# Patient Record
Sex: Male | Born: 1965 | Race: White | Hispanic: No | Marital: Married | Smoking: Never smoker
Health system: Southern US, Community
[De-identification: ages and names within clinical notes are randomized; demographics above are authoritative.]

## PROBLEM LIST (undated history)

## (undated) DIAGNOSIS — M109 Gout, unspecified: Secondary | ICD-10-CM

## (undated) DIAGNOSIS — K5792 Diverticulitis of intestine, part unspecified, without perforation or abscess without bleeding: Secondary | ICD-10-CM

## (undated) DIAGNOSIS — K219 Gastro-esophageal reflux disease without esophagitis: Secondary | ICD-10-CM

## (undated) HISTORY — DX: Diverticulitis of intestine, part unspecified, without perforation or abscess without bleeding: K57.92

---
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
## 2008-03-13 ENCOUNTER — Ambulatory Visit: Payer: Self-pay | Admitting: Gastroenterology

## 2008-03-13 ENCOUNTER — Inpatient Hospital Stay: Payer: Self-pay | Admitting: Gastroenterology

## 2008-05-08 ENCOUNTER — Ambulatory Visit: Payer: Self-pay | Admitting: Gastroenterology

## 2008-09-19 DIAGNOSIS — K5792 Diverticulitis of intestine, part unspecified, without perforation or abscess without bleeding: Secondary | ICD-10-CM

## 2008-09-19 HISTORY — DX: Diverticulitis of intestine, part unspecified, without perforation or abscess without bleeding: K57.92

## 2011-09-22 ENCOUNTER — Ambulatory Visit: Payer: Self-pay | Admitting: Gastroenterology

## 2015-07-01 ENCOUNTER — Encounter: Payer: Self-pay | Admitting: *Deleted

## 2015-07-28 ENCOUNTER — Ambulatory Visit (INDEPENDENT_AMBULATORY_CARE_PROVIDER_SITE_OTHER): Payer: 59 | Admitting: General Surgery

## 2015-07-28 ENCOUNTER — Encounter: Payer: Self-pay | Admitting: General Surgery

## 2015-07-28 VITALS — BP 140/82 | HR 80 | Resp 14 | Ht 71.0 in | Wt 261.0 lb

## 2015-07-28 DIAGNOSIS — K429 Umbilical hernia without obstruction or gangrene: Secondary | ICD-10-CM | POA: Diagnosis not present

## 2015-07-28 NOTE — Progress Notes (Signed)
Patient ID: Francisco Roman, male   DOB: 03/09/1966, 49 y.o.   MRN: [id number]  Chief Complaint  Patient presents with  . Hernia    HPI Francisco Roman is a 49 y.o. male.  Here today for evaluation of umbilical hernia. He states he noticed it about 3 years ago. He states it has gotten larger over the past year. He states it is tender. Bowels move regular and daily with the use of Miralax..  In 2010 the patient had a severe episode of left lower quadrant pain. He was separately identified to have a microperforation. He was treated with IV antibiotic. He did not require percutaneous drainage. He intermittently will have some mild left lower quadrant discomfort but otherwise has been asymptomatic.  I personally reviewed the history with the patient.   HPI  Past Medical History  Diagnosis Date  . Diverticulitis 2010    with small perforation    Past Surgical History  Procedure Laterality Date  . Hernia repair Left 1986    No family history on file.  Social History Social History  Substance Use Topics  . Smoking status: Never Smoker   . Smokeless tobacco: Never Used  . Alcohol Use: 0.0 oz/week    0 Standard drinks or equivalent per week     Comment: 12/week     No Known Allergies  Current Outpatient Prescriptions  Medication Sig Dispense Refill  . Multiple Vitamin (MULTIVITAMIN) capsule Take 1 capsule by mouth daily.    . polyethylene glycol (MIRALAX / GLYCOLAX) packet Take 17 g by mouth daily.      No current facility-administered medications for this visit.    Review of Systems Review of Systems  Constitutional: Negative.   Respiratory: Negative.   Cardiovascular: Negative.   Gastrointestinal: Negative for diarrhea, constipation and blood in stool.    Blood pressure 140/82, pulse 80, resp. rate 14, height 5\' 11"  (1.803 m), weight 261 lb (118.389 kg).  Physical Exam Physical Exam  Constitutional: He is oriented to person, place, and time. He appears  well-developed and well-nourished.  HENT:  Mouth/Throat: Oropharynx is clear and moist.  Eyes: No scleral icterus.  Neck: Neck supple.  Cardiovascular: Normal rate, regular rhythm and normal heart sounds.   Pulmonary/Chest: Effort normal and breath sounds normal.  Abdominal: Soft. Normal appearance and bowel sounds are normal. There is tenderness.  1 cm umbilical facial defect with tenderness.  Neurological: He is alert and oriented to person, place, and time.  Skin: Skin is warm.  Psychiatric: His behavior is normal.    Data Reviewed P notes from Maryland Pink, M.D. were reviewed. Hernia listed as 2-3 cm, this is the external bulge present fashioned defect.  Laboratory studies obtained during the 07/01/2015 exam showed a normal CBC with a hemoglobin of 14.9, MCV 94, white blood cell count 7300, normal differential, electrolytes were normal. Creatinine 1.0 with an estimated GFR of 80. Liver function studies were normal. Urinalysis was negative. Cholesterol and triglycerides were remarkably normal. PSA 0.69.  Assessment    Umbilical hernia    Plan    Indications for repair were reviewed. If the present size clinically estimated is correct, mesh will not be required. The role of prosthetic mesh was discussed. Upper lifting technique was reviewed.     Hernia precautions and incarceration were discussed with the patient. If they develop symptoms of an incarcerated hernia, they were encouraged to seek prompt medical attention.   Patient is scheduled for surgery at [facility] on 08/26/15. The  patient will pre admit via phone. He is aware of date and instructions.   Proper lifting techniques reviewed.   PCP:  Francisco Roman 07/29/2015, 6:12 AM

## 2015-07-28 NOTE — Patient Instructions (Addendum)
The patient is aware to call back for any questions or concerns. Hernia, Adult A hernia is the bulging of an organ or tissue through a weak spot in the muscles of the abdomen (abdominal wall). Hernias develop most often near the navel or groin. There are many kinds of hernias. Common kinds include:  Femoral hernia. This kind of hernia develops under the groin in the upper thigh area.  Inguinal hernia. This kind of hernia develops in the groin or scrotum.  Umbilical hernia. This kind of hernia develops near the navel.  Hiatal hernia. This kind of hernia causes part of the stomach to be pushed up into the chest.  Incisional hernia. This kind of hernia bulges through a scar from an abdominal surgery. CAUSES This condition may be caused by:  Heavy lifting.  Coughing over a long period of time.  Straining to have a bowel movement.  An incision made during an abdominal surgery.  A birth defect (congenital defect).  Excess weight or obesity.  Smoking.  Poor nutrition.  Cystic fibrosis.  Excess fluid in the abdomen.  Undescended testicles. SYMPTOMS Symptoms of a hernia include:  A lump on the abdomen. This is the first sign of a hernia. The lump may become more obvious with standing, straining, or coughing. It may get bigger over time if it is not treated or if the condition causing it is not treated.  Pain. A hernia is usually painless, but it may become painful over time if treatment is delayed. The pain is usually dull and may get worse with standing or lifting heavy objects. Sometimes a hernia gets tightly squeezed in the weak spot (strangulated) or stuck there (incarcerated) and causes additional symptoms. These symptoms may include:  Vomiting.  Nausea.  Constipation.  Irritability. DIAGNOSIS A hernia may be diagnosed with:  A physical exam. During the exam your health care provider may ask you to cough or to make a specific movement, because a hernia is usually  more visible when you move.  Imaging tests. These can include:  X-rays.  Ultrasound.  CT scan. TREATMENT A hernia that is small and painless may not need to be treated. A hernia that is large or painful may be treated with surgery. Inguinal hernias may be treated with surgery to prevent incarceration or strangulation. Strangulated hernias are always treated with surgery, because lack of blood to the trapped organ or tissue can cause it to die. Surgery to treat a hernia involves pushing the bulge back into place and repairing the weak part of the abdomen. HOME CARE INSTRUCTIONS  Avoid straining.  Do not lift anything heavier than 10 lb (4.5 kg).  Lift with your leg muscles, not your back muscles. This helps avoid strain.  When coughing, try to cough gently.  Prevent constipation. Constipation leads to straining with bowel movements, which can make a hernia worse or cause a hernia repair to break down. You can prevent constipation by:  Eating a high-fiber diet that includes plenty of fruits and vegetables.  Drinking enough fluids to keep your urine clear or pale yellow. Aim to drink 6-8 glasses of water per day.  Using a stool softener as directed by your health care provider.  Lose weight, if you are overweight.  Do not use any tobacco products, including cigarettes, chewing tobacco, or electronic cigarettes. If you need help quitting, ask your health care provider.  Keep all follow-up visits as directed by your health care provider. This is important. Your health care provider may   need to monitor your condition. SEEK MEDICAL CARE IF:  You have swelling, redness, and pain in the affected area.  Your bowel habits change. SEEK IMMEDIATE MEDICAL CARE IF:  You have a fever.  You have abdominal pain that is getting worse.  You feel nauseous or you vomit.  You cannot push the hernia back in place by gently pressing on it while you are lying down.  The hernia:  Changes in  shape or size.  Is stuck outside the abdomen.  Becomes discolored.  Feels hard or tender.   This information is not intended to replace advice given to you by your health care provider. Make sure you discuss any questions you have with your health care provider.   Document Released: 09/05/2005 Document Revised: 09/26/2014 Document Reviewed: 07/16/2014 Elsevier Interactive Patient Education [employer].  Patient is scheduled for surgery at [facility] on 08/26/15. The patient will pre admit via phone. He is aware of date and instructions.

## 2015-07-29 DIAGNOSIS — K429 Umbilical hernia without obstruction or gangrene: Secondary | ICD-10-CM | POA: Insufficient documentation

## 2015-07-29 NOTE — H&P (Signed)
HPI  Francisco Roman is a 49 y.o. male. Here today for evaluation of umbilical hernia. He states he noticed it about 3 years ago. He states it has gotten larger over the past year. He states it is tender. Bowels move regular and daily with the use of Miralax..  In 2010 the patient had a severe episode of left lower quadrant pain. He was separately identified to have a microperforation. He was treated with IV antibiotic. He did not require percutaneous drainage. He intermittently will have some mild left lower quadrant discomfort but otherwise has been asymptomatic.  I personally reviewed the history with the patient.  HPI  Past Medical History   Diagnosis  Date   .  Diverticulitis  2010     with small perforation    Past Surgical History   Procedure  Laterality  Date   .  Hernia repair  Left  1986    No family history on file.  Social History  Social History   Substance Use Topics   .  Smoking status:  Never Smoker   .  Smokeless tobacco:  Never Used   .  Alcohol Use:  0.0 oz/week     0 Standard drinks or equivalent per week      Comment: 12/week    No Known Allergies  Current Outpatient Prescriptions   Medication  Sig  Dispense  Refill   .  Multiple Vitamin (MULTIVITAMIN) capsule  Take 1 capsule by mouth daily.     .  polyethylene glycol (MIRALAX / GLYCOLAX) packet  Take 17 g by mouth daily.      No current facility-administered medications for this visit.    Review of Systems  Review of Systems  Constitutional: Negative.  Respiratory: Negative.  Cardiovascular: Negative.  Gastrointestinal: Negative for diarrhea, constipation and blood in stool.   Blood pressure 140/82, pulse 80, resp. rate 14, height 5\' 11"  (1.803 m), weight 261 lb (118.389 kg).  Physical Exam  Physical Exam  Constitutional: He is oriented to person, place, and time. He appears well-developed and well-nourished.  HENT:  Mouth/Throat: Oropharynx is clear and moist.  Eyes: No scleral icterus.  Neck:  Neck supple.  Cardiovascular: Normal rate, regular rhythm and normal heart sounds.  Pulmonary/Chest: Effort normal and breath sounds normal.  Abdominal: Soft. Normal appearance and bowel sounds are normal. There is tenderness.  1 cm umbilical facial defect with tenderness.  Neurological: He is alert and oriented to person, place, and time.  Skin: Skin is warm.  Psychiatric: His behavior is normal.   Data Reviewed  P notes from [place] Pink, M.D. were reviewed. Hernia listed as 2-3 cm, this is the external bulge present fashioned defect.  Laboratory studies obtained during the 07/01/2015 exam showed a normal CBC with a hemoglobin of 14.9, MCV 94, white blood cell count 7300, normal differential, electrolytes were normal. Creatinine 1.0 with an estimated GFR of 80. Liver function studies were normal. Urinalysis was negative. Cholesterol and triglycerides were remarkably normal. PSA 0.69.  Assessment   Umbilical hernia   Plan   Indications for repair were reviewed. If the present size clinically estimated is correct, mesh will not be required. The role of prosthetic mesh was discussed. Upper lifting technique was reviewed.   Hernia precautions and incarceration were discussed with the patient. If they develop symptoms of an incarcerated hernia, they were encouraged to seek prompt medical attention.  Patient is scheduled for surgery at [facility] on 08/26/15. The patient will pre admit via  phone. He is aware of date and instructions.  Proper lifting techniques reviewed.  PCP: Virgie Dad  07/29/2015, 6:12 AM

## 2015-07-30 ENCOUNTER — Telehealth: Payer: Self-pay | Admitting: *Deleted

## 2015-07-30 NOTE — Telephone Encounter (Signed)
Patient's surgery has been rescheduled from 08-26-15 to 09-09-15 per patient request.   This patient forgot he had a trip planned the weekend of surgery and wants to wait till after his return.

## 2015-08-10 ENCOUNTER — Other Ambulatory Visit: Payer: Self-pay

## 2015-08-20 HISTORY — PX: HERNIA REPAIR: SHX51

## 2015-08-26 ENCOUNTER — Encounter: Payer: Self-pay | Admitting: *Deleted

## 2015-08-26 ENCOUNTER — Other Ambulatory Visit: Payer: Self-pay

## 2015-08-26 NOTE — Patient Instructions (Signed)
  Your procedure is scheduled on: 09-09-15 Report to [place] To find out your arrival time please call [phone] between 1PM - 3PM on 09-08-15  Remember: Instructions that are not followed completely may result in serious medical risk, up to and including death, or upon the discretion of your surgeon and anesthesiologist your surgery may need to be rescheduled.    _X___ 1. Do not eat food or drink liquids after midnight. No gum chewing or hard candies.     _X___ 2. No Alcohol for 24 hours before or after surgery.   ____ 3. Bring all medications with you on the day of surgery if instructed.    ____ 4. Notify your doctor if there is any change in your medical condition     (cold, fever, infections).     Do not wear jewelry, make-up, hairpins, clips or nail polish.  Do not wear lotions, powders, or perfumes. You may wear deodorant.  Do not shave 48 hours prior to surgery. Men may shave face and neck.  Do not bring valuables to the hospital.    [facility] is not responsible for any belongings or valuables.               Contacts, dentures or bridgework may not be worn into surgery.  Leave your suitcase in the car. After surgery it may be brought to your room.  For patients admitted to the hospital, discharge time is determined by your treatment team.   Patients discharged the day of surgery will not be allowed to drive home.   Please read over the following fact sheets that you were given:      ____ Take these medicines the morning of surgery with A SIP OF WATER:    1. NONE  2.   3.   4.  5.  6.  ____ Fleet Enema (as directed)   ____ Use CHG Soap as directed  ____ Use inhalers on the day of surgery  ____ Stop metformin 2 days prior to surgery    ____ Take 1/2 of usual insulin dose the night before surgery and none on the morning of surgery.   ____ Stop Coumadin/Plavix/aspirin-N/A  ____ Stop Anti-inflammatories   ____ Stop  supplements until after surgery.    ____ Bring C-Pap to the hospital.

## 2015-09-09 ENCOUNTER — Encounter
Admission: RE | Disposition: A | Discharge status: Home / Self Care | Payer: Self-pay | Source: Ambulatory Visit | Attending: General Surgery

## 2015-09-09 ENCOUNTER — Ambulatory Visit
Admission: RE | Admit: 2015-09-09 | Discharge: 2015-09-09 | Disposition: A | Discharge status: Home / Self Care | Payer: 59 | Source: Ambulatory Visit | Attending: General Surgery | Admitting: General Surgery

## 2015-09-09 ENCOUNTER — Ambulatory Visit: Payer: 59 | Admitting: Anesthesiology

## 2015-09-09 DIAGNOSIS — K219 Gastro-esophageal reflux disease without esophagitis: Secondary | ICD-10-CM | POA: Insufficient documentation

## 2015-09-09 DIAGNOSIS — K420 Umbilical hernia with obstruction, without gangrene: Secondary | ICD-10-CM | POA: Insufficient documentation

## 2015-09-09 DIAGNOSIS — K429 Umbilical hernia without obstruction or gangrene: Secondary | ICD-10-CM

## 2015-09-09 HISTORY — DX: Gout, unspecified: M10.9

## 2015-09-09 HISTORY — DX: Gastro-esophageal reflux disease without esophagitis: K21.9

## 2015-09-09 HISTORY — PX: UMBILICAL HERNIA REPAIR: SHX196

## 2015-09-09 SURGERY — REPAIR, HERNIA, UMBILICAL, ADULT
Anesthesia: General | Site: Abdomen | Wound class: Clean

## 2015-09-09 MED ORDER — FAMOTIDINE 20 MG PO TABS
ORAL_TABLET | ORAL | Status: AC
  Administered 2015-09-09: 20 mg via ORAL
  Filled 2015-09-09: qty 1

## 2015-09-09 MED ORDER — MIDAZOLAM HCL 5 MG/5ML IJ SOLN
INTRAMUSCULAR | Status: DC | PRN
  Administered 2015-09-09: 2 mg via INTRAVENOUS

## 2015-09-09 MED ORDER — OXYCODONE HCL 5 MG PO TABS
ORAL_TABLET | ORAL | Status: DC
Start: 2015-09-09 — End: 2015-09-09
  Filled 2015-09-09: qty 1

## 2015-09-09 MED ORDER — CEFAZOLIN SODIUM-DEXTROSE 2-3 GM-% IV SOLR
2.0000 g | INTRAVENOUS | Status: AC
  Administered 2015-09-09: 2 g via INTRAVENOUS

## 2015-09-09 MED ORDER — OXYCODONE HCL 5 MG PO TABS
5.0000 mg | ORAL_TABLET | Freq: Once | ORAL | Status: AC | PRN
Start: 2015-09-09 — End: 2015-09-09
  Administered 2015-09-09: 5 mg via ORAL

## 2015-09-09 MED ORDER — CEFAZOLIN SODIUM-DEXTROSE 2-3 GM-% IV SOLR
INTRAVENOUS | Status: AC
  Administered 2015-09-09: 2 g via INTRAVENOUS
  Filled 2015-09-09: qty 50

## 2015-09-09 MED ORDER — LIDOCAINE HCL (PF) 1 % IJ SOLN
INTRAMUSCULAR | Status: AC
  Filled 2015-09-09: qty 30

## 2015-09-09 MED ORDER — FENTANYL CITRATE (PF) 100 MCG/2ML IJ SOLN
INTRAMUSCULAR | Status: AC
  Administered 2015-09-09: 25 ug via INTRAVENOUS
  Filled 2015-09-09: qty 2

## 2015-09-09 MED ORDER — SODIUM BICARBONATE 4 % IV SOLN
INTRAVENOUS | Status: AC
  Filled 2015-09-09: qty 5

## 2015-09-09 MED ORDER — BUPIVACAINE HCL (PF) 0.5 % IJ SOLN
INTRAMUSCULAR | Status: AC
  Filled 2015-09-09: qty 30

## 2015-09-09 MED ORDER — OXYCODONE HCL 5 MG/5ML PO SOLN: 5.0000 mg | Freq: Once | ORAL | Status: AC | PRN

## 2015-09-09 MED ORDER — BUPIVACAINE HCL 0.5 % IJ SOLN
INTRAMUSCULAR | Status: DC | PRN
  Administered 2015-09-09: 30 mL

## 2015-09-09 MED ORDER — FAMOTIDINE 20 MG PO TABS
20.0000 mg | ORAL_TABLET | Freq: Once | ORAL | Status: AC
  Administered 2015-09-09: 20 mg via ORAL

## 2015-09-09 MED ORDER — KETOROLAC TROMETHAMINE 30 MG/ML IJ SOLN
INTRAMUSCULAR | Status: DC | PRN
  Administered 2015-09-09: 30 mg via INTRAVENOUS

## 2015-09-09 MED ORDER — ACETAMINOPHEN 10 MG/ML IV SOLN
INTRAVENOUS | Status: AC
  Filled 2015-09-09: qty 100

## 2015-09-09 MED ORDER — PROPOFOL 10 MG/ML IV BOLUS
INTRAVENOUS | Status: DC | PRN
  Administered 2015-09-09: 200 mg via INTRAVENOUS

## 2015-09-09 MED ORDER — FENTANYL CITRATE (PF) 100 MCG/2ML IJ SOLN
INTRAMUSCULAR | Status: DC | PRN
  Administered 2015-09-09 (×4): 25 ug via INTRAVENOUS

## 2015-09-09 MED ORDER — HYDROCODONE-ACETAMINOPHEN 5-325 MG PO TABS: 1.0000 | ORAL_TABLET | ORAL | Status: DC | PRN

## 2015-09-09 MED ORDER — FENTANYL CITRATE (PF) 100 MCG/2ML IJ SOLN
25.0000 ug | INTRAMUSCULAR | Status: AC | PRN
  Administered 2015-09-09 (×6): 25 ug via INTRAVENOUS

## 2015-09-09 MED ORDER — LACTATED RINGERS IV SOLN
INTRAVENOUS | Status: DC
  Administered 2015-09-09: 12:00:00 via INTRAVENOUS

## 2015-09-09 MED ORDER — ACETAMINOPHEN 10 MG/ML IV SOLN
INTRAVENOUS | Status: DC | PRN
  Administered 2015-09-09: 1000 mg via INTRAVENOUS

## 2015-09-09 SURGICAL SUPPLY — 31 items
BENZOIN TINCTURE PRP APPL 2/3 (GAUZE/BANDAGES/DRESSINGS) ×2 IMPLANT
BLADE SURG 15 STRL SS SAFETY (BLADE) ×2 IMPLANT
CANISTER SUCT 1200ML W/VALVE (MISCELLANEOUS) ×2 IMPLANT
CHLORAPREP W/TINT 26ML (MISCELLANEOUS) ×2 IMPLANT
DRAPE LAPAROTOMY 100X77 ABD (DRAPES) ×2 IMPLANT
DRESSING TELFA 4X3 1S ST N-ADH (GAUZE/BANDAGES/DRESSINGS) ×2 IMPLANT
DRSG TEGADERM 4X4.75 (GAUZE/BANDAGES/DRESSINGS) ×2 IMPLANT
GAUZE SPONGE 4X4 12PLY STRL (GAUZE/BANDAGES/DRESSINGS) IMPLANT
GLOVE BIO SURGEON STRL SZ7.5 (GLOVE) ×4 IMPLANT
GLOVE INDICATOR 8.0 STRL GRN (GLOVE) ×4 IMPLANT
GOWN STRL REUS W/ TWL LRG LVL3 (GOWN DISPOSABLE) ×2 IMPLANT
GOWN STRL REUS W/TWL LRG LVL3 (GOWN DISPOSABLE) ×2
KIT RM TURNOVER STRD PROC AR (KITS) ×2 IMPLANT
LABEL OR SOLS (LABEL) IMPLANT
MESH VENTRALEX ST 2.5 CRC MED (Mesh General) ×2 IMPLANT
NDL SAFETY 22GX1.5 (NEEDLE) ×2 IMPLANT
NEEDLE HYPO 25X1 1.5 SAFETY (NEEDLE) ×2 IMPLANT
NS IRRIG 500ML POUR BTL (IV SOLUTION) ×2 IMPLANT
PACK BASIN MINOR ARMC (MISCELLANEOUS) ×2 IMPLANT
PAD GROUND ADULT SPLIT (MISCELLANEOUS) ×2 IMPLANT
STRIP CLOSURE SKIN 1/2X4 (GAUZE/BANDAGES/DRESSINGS) ×2 IMPLANT
SUT PROLENE 0 CT 1 30 (SUTURE) IMPLANT
SUT SURGILON 0 BLK (SUTURE) ×2 IMPLANT
SUT VIC AB 3-0 54X BRD REEL (SUTURE) ×2 IMPLANT
SUT VIC AB 3-0 BRD 54 (SUTURE) ×2
SUT VIC AB 3-0 SH 27 (SUTURE) ×1
SUT VIC AB 3-0 SH 27X BRD (SUTURE) ×1 IMPLANT
SUT VIC AB 4-0 FS2 27 (SUTURE) ×2 IMPLANT
SWABSTK COMLB BENZOIN TINCTURE (MISCELLANEOUS) ×2 IMPLANT
SYR 3ML LL SCALE MARK (SYRINGE) ×2 IMPLANT
SYR CONTROL 10ML (SYRINGE) ×4 IMPLANT

## 2015-09-09 NOTE — Anesthesia Preprocedure Evaluation (Signed)
Anesthesia Evaluation  Patient identified by MRN, date of birth, ID band Patient awake    Reviewed: Allergy & Precautions, H&P , NPO status , Patient's Chart, lab work & pertinent test results  History of Anesthesia Complications Negative for: history of anesthetic complications  Airway Mallampati: III  TM Distance: >3 FB Neck ROM: full    Dental  (+) Poor Dentition, Implants, Chipped   Pulmonary neg pulmonary ROS, neg shortness of breath,    Pulmonary exam normal breath sounds clear to auscultation       Cardiovascular Exercise Tolerance: Good (-) angina(-) Past MI and (-) DOE negative cardio ROS Normal cardiovascular exam Rhythm:regular Rate:Normal     Neuro/Psych negative neurological ROS  negative psych ROS   GI/Hepatic Neg liver ROS, GERD  Controlled,  Endo/Other  negative endocrine ROS  Renal/GU negative Renal ROS  negative genitourinary   Musculoskeletal   Abdominal   Peds  Hematology negative hematology ROS (+)   Anesthesia Other Findings Past Medical History:   Diverticulitis                                  2010           Comment:with small perforation   GERD (gastroesophageal reflux disease)                       Gout                                                        Past Surgical History:   HERNIA REPAIR                                   Left 1986        BMI    Body Mass Index   36.41 kg/m 2    Signs and symptoms suggestive of sleep apnea    Reproductive/Obstetrics negative OB ROS                             Anesthesia Physical Anesthesia Plan  ASA: III  Anesthesia Plan: General LMA   Post-op Pain Management:    Induction:   Airway Management Planned:   Additional Equipment:   Intra-op Plan:   Post-operative Plan:   Informed Consent: I have reviewed the patients History and Physical, chart, labs and discussed the procedure including the risks,  benefits and alternatives for the proposed anesthesia with the patient or authorized representative who has indicated his/her understanding and acceptance.   Dental Advisory Given  Plan Discussed with: Anesthesiologist, CRNA and Surgeon  Anesthesia Plan Comments:         Anesthesia Quick Evaluation

## 2015-09-09 NOTE — Anesthesia Procedure Notes (Signed)
Procedure Name: LMA Insertion Date/Time: 09/09/2015 11:52 AM Performed by: Dionne Bucy Pre-anesthesia Checklist: Patient identified, Patient being monitored, Timeout performed, Emergency Drugs available and Suction available Patient Re-evaluated:Patient Re-evaluated prior to inductionOxygen Delivery Method: Circle system utilized Preoxygenation: Pre-oxygenation with 100% oxygen Intubation Type: IV induction Ventilation: Mask ventilation without difficulty LMA: LMA inserted Tube type: Oral Number of attempts: 1 Placement Confirmation: positive ETCO2 and breath sounds checked- equal and bilateral Tube secured with: Tape Dental Injury: Teeth and Oropharynx as per pre-operative assessment

## 2015-09-09 NOTE — Anesthesia Postprocedure Evaluation (Signed)
Anesthesia Post Note  Patient: Francisco Roman  Procedure(s) Performed: Procedure(s) (LRB): HERNIA REPAIR UMBILICAL ADULT (N/A)  Patient location during evaluation: PACU Anesthesia Type: General Level of consciousness: awake and alert Pain management: satisfactory to patient Vital Signs Assessment: post-procedure vital signs reviewed and stable Respiratory status: respiratory function stable Cardiovascular status: stable Anesthetic complications: no    Last Vitals:  Filed Vitals:   09/09/15 1241 09/09/15 1256  BP: 125/85 120/73  Pulse: 66 69  Temp:    Resp: 16 14    Last Pain:  Filed Vitals:   09/09/15 1257  PainSc: 4                  VAN STAVEREN,Shamond Skelton

## 2015-09-09 NOTE — H&P (Signed)
Francisco Roman SX:[id number] [birth year]     HPI: 49 y/o with umbilical hernia. Recent gout treated with prednisone.   Prescriptions prior to admission  Medication Sig Dispense Refill Last Dose  . ibuprofen (ADVIL,MOTRIN) 200 MG tablet Take 200 mg by mouth every 6 (six) hours as needed.   Past Month at Unknown time  . Multiple Vitamin (MULTIVITAMIN) capsule Take 1 capsule by mouth daily.   09/08/2015 at 0600  . polyethylene glycol (MIRALAX / GLYCOLAX) packet Take 17 g by mouth daily.    09/08/2015 at 0600  . predniSONE (DELTASONE) 20 MG tablet Take 20 mg by mouth daily with breakfast.   09/08/2015 at 0630   No Known Allergies Past Medical History  Diagnosis Date  . Diverticulitis 2010    with small perforation  . GERD (gastroesophageal reflux disease)   . Gout    Past Surgical History  Procedure Laterality Date  . Hernia repair Left 1986   Social History   Social History  . Marital Status: Married    Spouse Name: N/A  . Number of Children: N/A  . Years of Education: N/A   Occupational History  . Not on file.   Social History Main Topics  . Smoking status: Never Smoker   . Smokeless tobacco: Never Used  . Alcohol Use: 1.2 oz/week    0 Standard drinks or equivalent, 2 Cans of beer per week     Comment: 12/week -WEEKEND DRINKER PER PT  . Drug Use: No  . Sexual Activity: Not on file   Other Topics Concern  . Not on file   Social History Narrative   Social History   Social History Narrative     ROS: Negative.     PE: HEENT: Negative. Lungs: Clear. Cardio: RR. Abd: Non-reducible umbilical hernia. Robert Bellow 09/09/2015   Assessment/Plan:  Proceed with planned hernia repair, mesh if defect is larger than expected.

## 2015-09-09 NOTE — Op Note (Signed)
Preoperative diagnosis: Umbilical hernia with incarcerated fat.  Postoperative diagnosis: Same.  Operative procedure umbilical hernia repair ventral X ST mesh.  Operative surgeon: Ollen Bowl, M.D.  Anesthesia: Gen. by LMA, Marcaine 0.5%, plain, 30 mL local infiltration.  Estimated blood loss: Less than 5 mL.  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 He was made for elective repair. He was sent Kefzol prior to procedure. There was movement with clippers in the holding area. SCD stockings for DVT prevention.  Operative note:  With the patient under adequate general anesthesia the abdomen was prepped with ChloraPrep and draped. R cane was infiltrated for postoperative analgesia. An infraumbilical incision was made and carried out the skin and subcutis tissue with hemostasis achieved with electro cautery. The umbilical skin was elevated off the hernia. The fascial layer was cleared and the contents returned to the abdominal cavity. The defect measured approximately 2.5 cm in diameter. Considering the strenuous work the patient completes it was elected to make use of a 6.4 cm Ventralex ST mesh. This was placed in the prefascial plane and anchored with 0 Surgilon trans-fascial sutures in 4 corners. The fascia was then approximated transversely with interrupted 0 Surgilon sutures taking a bite of the mesh with each suture. The umbilical skin was tacked to the fascia with a 3-0 Vicryl figure-of-eight suture. The adipose layer was closed with a running 3-0 Vicryls suture. The skin was closed with a running 4-0 Vicryl septic suture. Benzoin, Steri-Strips, Telfa and Tegaderm dressing applied.  The patient tolerated the procedure well and was taken to recovery stable condition.

## 2015-09-09 NOTE — Discharge Instructions (Signed)

## 2015-09-09 NOTE — Transfer of Care (Signed)
Immediate Anesthesia Transfer of Care Note  Patient: Francisco Roman  Procedure(s) Performed: Procedure(s): HERNIA REPAIR UMBILICAL ADULT (N/A)  Patient Location: PACU  Anesthesia Type:General  Level of Consciousness: awake and alert   Airway & Oxygen Therapy: Patient Spontanous Breathing and Patient connected to face mask oxygen  Post-op Assessment: Report given to RN  Post vital signs: stable  Last Vitals:  Filed Vitals:   09/09/15 1226 09/09/15 1228  BP: 143/92 143/92  Pulse: 75 71  Temp: 36.6 C 36.5 C  Resp: 13 19    Complications: No apparent anesthesia complications

## 2015-09-10 ENCOUNTER — Telehealth: Payer: Self-pay

## 2015-09-10 NOTE — Telephone Encounter (Signed)
Patient's wife Otila Kluver called and said the the patient is not tolerating the Hydrocodone very well. He is complaining of hot sweats and dizziness since starting this yesterday. Spoke with Dr Bary Castilla about this and will call in Tramadol 50 mg 1-2 by mouth every four hours as needed for pain #40. She expresses understanding and will pick this up for him. Tramadol called into [facility] in [place], prescription taken by [facility].

## 2015-09-24 ENCOUNTER — Ambulatory Visit (INDEPENDENT_AMBULATORY_CARE_PROVIDER_SITE_OTHER): Payer: 59 | Admitting: General Surgery

## 2015-09-24 ENCOUNTER — Encounter: Payer: Self-pay | Admitting: General Surgery

## 2015-09-24 VITALS — BP 142/82 | HR 78 | Resp 14 | Ht 71.0 in | Wt 257.0 lb

## 2015-09-24 DIAGNOSIS — K429 Umbilical hernia without obstruction or gangrene: Secondary | ICD-10-CM

## 2015-09-24 NOTE — Patient Instructions (Signed)
Patient to return as needed. 

## 2015-09-24 NOTE — Progress Notes (Signed)
Patient ID: Francisco Roman, male   DOB: [birth year], 50 y.o.   MRN: [id number]  Chief Complaint  Patient presents with  . Routine Post Op    HPI Francisco Roman is a 50 y.o. male.  Here today for his postoperative visit, umbilical hernia on [id number]. He states he is doing well. He did have a flare up of his gout (right toe) after Christmas which is better now.  I reviewed the patient's history. HPI  Past Medical History  Diagnosis Date  . Diverticulitis 2010    with small perforation  . GERD (gastroesophageal reflux disease)   . Gout     Past Surgical History  Procedure Laterality Date  . Umbilical hernia repair N/A 09/09/2015    Procedure: HERNIA REPAIR UMBILICAL ADULT;  Surgeon: Robert Bellow, MD;  Location: [facility] ORS;  Service: General;  Laterality: N/A;  . Hernia repair Left 1986  . Hernia repair  December [id number]    Umbilical, 6.4 cm Ventralex ST mesh    No family history on file.  Social History Social History  Substance Use Topics  . Smoking status: Never Smoker   . Smokeless tobacco: Never Used  . Alcohol Use: 1.2 oz/week    0 Standard drinks or equivalent, 2 Cans of beer per week     Comment: 12/week -WEEKEND DRINKER PER PT    No Known Allergies  Current Outpatient Prescriptions  Medication Sig Dispense Refill  . ibuprofen (ADVIL,MOTRIN) 200 MG tablet Take 200 mg by mouth every 6 (six) hours as needed.    . Multiple Vitamin (MULTIVITAMIN) capsule Take 1 capsule by mouth daily.    . polyethylene glycol (MIRALAX / GLYCOLAX) packet Take 17 g by mouth daily.      No current facility-administered medications for this visit.    Review of Systems Review of Systems  Constitutional: Negative.   Respiratory: Negative.   Cardiovascular: Negative.     Blood pressure 142/82, pulse 78, resp. rate 14, height 5\' 11"  (1.803 m), weight 257 lb (116.574 kg).  Physical Exam Physical Exam  Constitutional: He is oriented to person, place, and time. He appears  well-developed and well-nourished.  Abdominal: Soft. Normal appearance and bowel sounds are normal. There is no tenderness.  Umbilical hernia repair is clean and healing well.   Neurological: He is alert and oriented to person, place, and time.  Skin: Skin is warm and dry.      Assessment    Doing well status post umbilical hernia repair.    Plan    Proper lifting techniques reviewed.    Resume activities as tolerated. Patient to return as needed. PCP:  [place] Pink This information has been scribed by Karie Fetch [place].   Robert Bellow 09/26/2015, 9:17 AM

## 2015-09-26 ENCOUNTER — Encounter: Payer: Self-pay | Admitting: General Surgery

## 2016-04-25 ENCOUNTER — Emergency Department: Payer: 59

## 2016-04-25 ENCOUNTER — Encounter: Payer: Self-pay | Admitting: Emergency Medicine

## 2016-04-25 ENCOUNTER — Emergency Department
Admission: EM | Admit: 2016-04-25 | Discharge: 2016-04-25 | Disposition: A | Discharge status: Home / Self Care | Payer: 59 | Attending: Emergency Medicine | Admitting: Emergency Medicine

## 2016-04-25 DIAGNOSIS — R079 Chest pain, unspecified: Secondary | ICD-10-CM

## 2016-04-25 DIAGNOSIS — R0789 Other chest pain: Secondary | ICD-10-CM | POA: Insufficient documentation

## 2016-04-25 LAB — BASIC METABOLIC PANEL
ANION GAP: 7 (ref 5–15)
BUN: 19 mg/dL (ref 6–20)
CHLORIDE: 105 mmol/L (ref 101–111)
CO2: 25 mmol/L (ref 22–32)
Calcium: 9 mg/dL (ref 8.9–10.3)
Creatinine, Ser: 0.8 mg/dL (ref 0.61–1.24)
GFR calc non Af Amer: >60 mL/min (ref >60)
Glucose, Bld: 94 mg/dL (ref 65–99)
POTASSIUM: 4 mmol/L (ref 3.5–5.1)
Sodium: 137 mmol/L (ref 135–145)

## 2016-04-25 LAB — CBC
HEMATOCRIT: 41.8 % (ref 40.0–52.0)
HEMOGLOBIN: 14.9 g/dL (ref 13.0–18.0)
MCH: 33.6 pg (ref 26.0–34.0)
MCHC: 35.7 g/dL (ref 32.0–36.0)
MCV: 94.2 fL (ref 80.0–100.0)
Platelets: 224 10*3/uL (ref 150–440)
RBC: 4.43 MIL/uL (ref 4.40–5.90)
RDW: 12.5 % (ref 11.5–14.5)
WBC: 8.9 10*3/uL (ref 3.8–10.6)

## 2016-04-25 LAB — TROPONIN I
Troponin I: <0.03 ng/mL (ref <0.03)
Troponin I: <0.03 ng/mL (ref <0.03)

## 2016-04-25 MED ORDER — GI COCKTAIL ~~LOC~~
ORAL | Status: AC
  Administered 2016-04-25: 30 mL
  Filled 2016-04-25: qty 30

## 2016-04-25 NOTE — Discharge Instructions (Signed)
You have been seen in the emergency department today for chest pain. Your workup has shown normal results. As we discussed please follow-up with your primary care physician in the next 1-2 days for recheck. Return to the emergency department for any further chest pain, trouble breathing, or any other symptom personally concerning to yourself. °

## 2016-04-25 NOTE — ED Provider Notes (Signed)
[facility] Emergency Department Provider Note  Time seen: 8:02 PM  I have reviewed the triage vital signs and the nursing notes.   HISTORY  Chief Complaint Chest Pain    HPI Francisco Roman is a 50 y.o. male with a past medical history of gastric reflux who presents to the emergency department with chest pain. According to the patient around 1 PM today he was experiencing chest discomfort which she believes is consistent with indigestion. States at one point he got very sweaty so he went to his doctor for evaluation. He was referred to the emergency department from his primary care doctor. Patient states minimal discomfort at this time, he states it was greatly relieved after belching. Denies any abdominal pain. Denies any history of heart disease. Patient states the pain was moderate at its worst, nearly gone currently.  Past Medical History:  Diagnosis Date  . Diverticulitis 2010   with small perforation  . GERD (gastroesophageal reflux disease)   . Gout     Patient Active Problem List   Diagnosis Date Noted  . Umbilical hernia without obstruction and without gangrene 07/29/2015    Past Surgical History:  Procedure Laterality Date  . HERNIA REPAIR Left 1986  . HERNIA REPAIR  December [id number]   Umbilical, 6.4 cm Ventralex ST mesh  . UMBILICAL HERNIA REPAIR N/A 09/09/2015   Procedure: HERNIA REPAIR UMBILICAL ADULT;  Surgeon: Robert Bellow, MD;  Location: [facility] ORS;  Service: General;  Laterality: N/A;    Prior to Admission medications   Medication Sig Start Date End Date Taking? Authorizing Provider  ibuprofen (ADVIL,MOTRIN) 200 MG tablet Take 200 mg by mouth every 6 (six) hours as needed.    Historical Provider, MD  Multiple Vitamin (MULTIVITAMIN) capsule Take 1 capsule by mouth daily.    Historical Provider, MD  polyethylene glycol (MIRALAX / GLYCOLAX) packet Take 17 g by mouth daily.     Historical Provider, MD    No Known Allergies  No  family history on file.  Social History Social History  Substance Use Topics  . Smoking status: Never Smoker  . Smokeless tobacco: Never Used  . Alcohol use 1.2 oz/week    2 Cans of beer per week     Comment: 12/week -WEEKEND DRINKER PER PT    Review of Systems Constitutional: Negative for fever. Cardiovascular: Mild chest pain. Respiratory: Negative for shortness of breath. Gastrointestinal: Negative for abdominal pain Musculoskeletal: Negative for back pain Neurological: Negative for headaches, focal weakness or numbness. 10-point ROS otherwise negative.  ____________________________________________   PHYSICAL EXAM:  VITAL SIGNS: ED Triage Vitals  Enc Vitals Group     BP 04/25/16 1659 (!) 138/96     Pulse Rate 04/25/16 1659 74     Resp 04/25/16 1659 20     Temp 04/25/16 1659 98 F (36.7 C)     Temp Source 04/25/16 1659 Oral     SpO2 04/25/16 1659 99 %     Weight 04/25/16 1659 250 lb (113.4 kg)     Height 04/25/16 1659 5\' 11"  (1.803 m)     Head Circumference --      Peak Flow --      Pain Score 04/25/16 1811 3     Pain Loc --      Pain Edu? --      Excl. in [place]? --     Constitutional: Alert and oriented. Well appearing and in no distress. Eyes: Normal exam ENT   Head: Normocephalic  and atraumatic.   Mouth/Throat: Mucous membranes are moist. Cardiovascular: Normal rate, regular rhythm. No murmur Respiratory: Normal respiratory effort without tachypnea nor retractions. Breath sounds are clear Gastrointestinal: Soft and nontender. No distention.   Musculoskeletal: Nontender with normal range of motion in all extremities. No lower extremity tenderness or edema. Neurologic:  Normal speech and language. No gross focal neurologic deficits are appreciated. Skin:  Skin is warm, dry and intact.  Psychiatric: Mood and affect are normal. Speech and behavior are normal.   ____________________________________________    EKG  EKG reviewed and interpreted by  myself shows normal sinus rhythm at 75 bpm, narrow QRS, normal axis, normal intervals, no ST changes. Normal EKG.  ____________________________________________    INITIAL IMPRESSION / ASSESSMENT AND PLAN / ED COURSE  Pertinent labs & imaging results that were available during my care of the patient were reviewed by me and considered in my medical decision making (see chart for details).  The patient presents the emergency department chest discomfort beginning around 1 PM. Patient states slight to minimal chest discomfort currently, states it feels like gastric reflux/indigestion. Patient's labs are within normal limits including negative troponin. Normal EKG. We will treat with a GI cocktail and repeat a second troponin. If the patient's second troponin is negative we'll discharge with cardiology follow-up for further evaluation and possible stress test. Patient agreeable to plan.  ____________________________________________   FINAL CLINICAL IMPRESSION(S) / ED DIAGNOSES  Chest pain    Harvest Dark, MD 04/25/16 2004

## 2016-04-25 NOTE — ED Triage Notes (Signed)
Brought over from [facility] with substernal chest pain since 1 pm  Diaphoresis   No sob or n/v   Pain was non radiating

## 2016-04-26 ENCOUNTER — Telehealth: Payer: Self-pay | Admitting: Cardiology

## 2016-04-26 NOTE — Telephone Encounter (Signed)
Lmov to call and schedule ED fu from 04/25/16  seen for CP

## 2016-11-04 DIAGNOSIS — M1000 Idiopathic gout, unspecified site: Secondary | ICD-10-CM | POA: Diagnosis not present

## 2016-11-04 DIAGNOSIS — M25571 Pain in right ankle and joints of right foot: Secondary | ICD-10-CM | POA: Diagnosis not present

## 2017-01-26 DIAGNOSIS — M109 Gout, unspecified: Secondary | ICD-10-CM | POA: Diagnosis not present

## 2017-01-26 DIAGNOSIS — Z0000 Encounter for general adult medical examination without abnormal findings: Secondary | ICD-10-CM | POA: Diagnosis not present

## 2017-02-10 DIAGNOSIS — S93432A Sprain of tibiofibular ligament of left ankle, initial encounter: Secondary | ICD-10-CM | POA: Diagnosis not present

## 2017-08-31 DIAGNOSIS — M109 Gout, unspecified: Secondary | ICD-10-CM | POA: Diagnosis not present

## 2017-09-04 DIAGNOSIS — M109 Gout, unspecified: Secondary | ICD-10-CM | POA: Diagnosis not present

## 2017-09-04 DIAGNOSIS — D2262 Melanocytic nevi of left upper limb, including shoulder: Secondary | ICD-10-CM | POA: Diagnosis not present

## 2017-12-27 DIAGNOSIS — H5213 Myopia, bilateral: Secondary | ICD-10-CM | POA: Diagnosis not present

## 2018-01-24 DIAGNOSIS — Z0000 Encounter for general adult medical examination without abnormal findings: Secondary | ICD-10-CM | POA: Diagnosis not present

## 2018-01-30 DIAGNOSIS — Z0000 Encounter for general adult medical examination without abnormal findings: Secondary | ICD-10-CM | POA: Diagnosis not present

## 2018-01-30 DIAGNOSIS — R5381 Other malaise: Secondary | ICD-10-CM | POA: Diagnosis not present

## 2018-01-30 DIAGNOSIS — M1A9XX0 Chronic gout, unspecified, without tophus (tophi): Secondary | ICD-10-CM | POA: Diagnosis not present

## 2018-02-10 DIAGNOSIS — G4733 Obstructive sleep apnea (adult) (pediatric): Secondary | ICD-10-CM | POA: Diagnosis not present

## 2018-04-12 DIAGNOSIS — G4733 Obstructive sleep apnea (adult) (pediatric): Secondary | ICD-10-CM | POA: Diagnosis not present

## 2018-05-13 DIAGNOSIS — G4733 Obstructive sleep apnea (adult) (pediatric): Secondary | ICD-10-CM | POA: Diagnosis not present

## 2018-06-13 DIAGNOSIS — G4733 Obstructive sleep apnea (adult) (pediatric): Secondary | ICD-10-CM | POA: Diagnosis not present

## 2018-06-26 DIAGNOSIS — R29898 Other symptoms and signs involving the musculoskeletal system: Secondary | ICD-10-CM | POA: Diagnosis not present

## 2018-06-26 DIAGNOSIS — G4733 Obstructive sleep apnea (adult) (pediatric): Secondary | ICD-10-CM | POA: Diagnosis not present

## 2018-06-26 DIAGNOSIS — Z1211 Encounter for screening for malignant neoplasm of colon: Secondary | ICD-10-CM | POA: Diagnosis not present

## 2018-07-13 DIAGNOSIS — Z1211 Encounter for screening for malignant neoplasm of colon: Secondary | ICD-10-CM | POA: Diagnosis not present

## 2018-07-13 DIAGNOSIS — E669 Obesity, unspecified: Secondary | ICD-10-CM | POA: Diagnosis not present

## 2018-07-13 DIAGNOSIS — G4733 Obstructive sleep apnea (adult) (pediatric): Secondary | ICD-10-CM | POA: Diagnosis not present

## 2018-08-08 DIAGNOSIS — G4733 Obstructive sleep apnea (adult) (pediatric): Secondary | ICD-10-CM | POA: Diagnosis not present

## 2018-08-13 DIAGNOSIS — G4733 Obstructive sleep apnea (adult) (pediatric): Secondary | ICD-10-CM | POA: Diagnosis not present

## 2018-09-03 DIAGNOSIS — D0359 Melanoma in situ of other part of trunk: Secondary | ICD-10-CM | POA: Diagnosis not present

## 2018-09-03 DIAGNOSIS — D485 Neoplasm of uncertain behavior of skin: Secondary | ICD-10-CM | POA: Diagnosis not present

## 2018-09-03 DIAGNOSIS — D2262 Melanocytic nevi of left upper limb, including shoulder: Secondary | ICD-10-CM | POA: Diagnosis not present

## 2018-09-03 DIAGNOSIS — D763 Other histiocytosis syndromes: Secondary | ICD-10-CM | POA: Diagnosis not present

## 2018-09-03 DIAGNOSIS — D225 Melanocytic nevi of trunk: Secondary | ICD-10-CM | POA: Diagnosis not present

## 2018-09-03 DIAGNOSIS — D2261 Melanocytic nevi of right upper limb, including shoulder: Secondary | ICD-10-CM | POA: Diagnosis not present

## 2018-09-12 DIAGNOSIS — G4733 Obstructive sleep apnea (adult) (pediatric): Secondary | ICD-10-CM | POA: Diagnosis not present

## 2018-10-13 DIAGNOSIS — G4733 Obstructive sleep apnea (adult) (pediatric): Secondary | ICD-10-CM | POA: Diagnosis not present

## 2018-11-23 ENCOUNTER — Ambulatory Visit: Payer: BLUE CROSS/BLUE SHIELD | Admitting: Anesthesiology

## 2018-11-23 ENCOUNTER — Ambulatory Visit
Admission: RE | Admit: 2018-11-23 | Discharge: 2018-11-23 | Disposition: A | Discharge status: Home / Self Care | Payer: BLUE CROSS/BLUE SHIELD | Attending: Gastroenterology | Admitting: Gastroenterology

## 2018-11-23 ENCOUNTER — Encounter
Admission: RE | Disposition: A | Discharge status: Home / Self Care | Payer: Self-pay | Source: Home / Self Care | Attending: Gastroenterology

## 2018-11-23 DIAGNOSIS — Z9989 Dependence on other enabling machines and devices: Secondary | ICD-10-CM | POA: Insufficient documentation

## 2018-11-23 DIAGNOSIS — Z1211 Encounter for screening for malignant neoplasm of colon: Secondary | ICD-10-CM | POA: Diagnosis present

## 2018-11-23 DIAGNOSIS — K640 First degree hemorrhoids: Secondary | ICD-10-CM | POA: Diagnosis not present

## 2018-11-23 DIAGNOSIS — Z6835 Body mass index (BMI) 35.0-35.9, adult: Secondary | ICD-10-CM | POA: Diagnosis not present

## 2018-11-23 DIAGNOSIS — G4730 Sleep apnea, unspecified: Secondary | ICD-10-CM | POA: Diagnosis not present

## 2018-11-23 DIAGNOSIS — K5730 Diverticulosis of large intestine without perforation or abscess without bleeding: Secondary | ICD-10-CM | POA: Diagnosis not present

## 2018-11-23 HISTORY — PX: COLONOSCOPY WITH PROPOFOL: SHX5780

## 2018-11-23 SURGERY — COLONOSCOPY WITH PROPOFOL
Anesthesia: General

## 2018-11-23 MED ORDER — SODIUM CHLORIDE 0.9 % IV SOLN
INTRAVENOUS | Status: DC
  Administered 2018-11-23: 1000 mL via INTRAVENOUS

## 2018-11-23 MED ORDER — PROPOFOL 10 MG/ML IV BOLUS
INTRAVENOUS | Status: DC | PRN
  Administered 2018-11-23: 60 mg via INTRAVENOUS

## 2018-11-23 MED ORDER — PROPOFOL 10 MG/ML IV BOLUS
INTRAVENOUS | Status: AC
  Filled 2018-11-23: qty 20

## 2018-11-23 MED ORDER — PROPOFOL 500 MG/50ML IV EMUL
INTRAVENOUS | Status: AC
  Filled 2018-11-23: qty 50

## 2018-11-23 MED ORDER — PROPOFOL 500 MG/50ML IV EMUL
INTRAVENOUS | Status: DC | PRN
  Administered 2018-11-23: 150 ug/kg/min via INTRAVENOUS

## 2018-11-23 MED ORDER — LIDOCAINE HCL (PF) 1 % IJ SOLN
INTRAMUSCULAR | Status: AC
  Administered 2018-11-23: 0.3 mL
  Filled 2018-11-23: qty 2

## 2018-11-23 NOTE — H&P (Signed)
Outpatient short stay form Pre-procedure 11/23/2018 7:24 AM Lollie Sails MD  Primary Physician: [place] Pink, MD  Reason for visit: Colonoscopy  History of present illness: Patient is a 53 year old male presenting today for a colon cancer screening.  He has a history of diverticulitis and a colonoscopy done in 2009.  Had no recurrence since then.  Tolerated his prep well.  He does take a daily 81 mg aspirin that has been held.    Current Facility-Administered Medications:  .  0.9 %  sodium chloride infusion, , Intravenous, Continuous, Lollie Sails, MD .  lidocaine (PF) (XYLOCAINE) 1 % injection, , , ,   Medications Prior to Admission  Medication Sig Dispense Refill Last Dose  . ibuprofen (ADVIL,MOTRIN) 200 MG tablet Take 200 mg by mouth every 6 (six) hours as needed.   Taking  . Multiple Vitamin (MULTIVITAMIN) capsule Take 1 capsule by mouth daily.   Taking  . polyethylene glycol (MIRALAX / GLYCOLAX) packet Take 17 g by mouth daily.    Taking     No Known Allergies   Past Medical History:  Diagnosis Date  . Diverticulitis 2010   with small perforation  . GERD (gastroesophageal reflux disease)   . Gout     Review of systems:      Physical Exam    Heart and lungs: Regular rate and rhythm without rub or gallop, lungs are bilaterally clear.    HEENT: Normocephalic atraumatic eyes are anicteric    Other:    Pertinant exam for procedure: Soft nontender nondistended bowel sounds positive normoactive    Planned proceedures: Colonoscopy and indicated procedures. I have discussed the risks benefits and complications of procedures to include not limited to bleeding, infection, perforation and the risk of sedation and the patient wishes to proceed.    Lollie Sails, MD Gastroenterology 11/23/2018  7:24 AM

## 2018-11-23 NOTE — Anesthesia Postprocedure Evaluation (Signed)
Anesthesia Post Note  Patient: Francisco Roman  Procedure(s) Performed: COLONOSCOPY WITH PROPOFOL (N/A )  Patient location during evaluation: PACU Anesthesia Type: General Level of consciousness: awake and alert Pain management: pain level controlled Vital Signs Assessment: post-procedure vital signs reviewed and stable Respiratory status: spontaneous breathing, nonlabored ventilation and respiratory function stable Cardiovascular status: blood pressure returned to baseline and stable Postop Assessment: no apparent nausea or vomiting Anesthetic complications: no     Last Vitals:  Vitals:   11/23/18 0818 11/23/18 0829  BP: 95/72 127/74  Pulse: 71 71  Resp: 17 14  Temp:    SpO2: 98% 99%    Last Pain:  Vitals:   11/23/18 0829  TempSrc:   PainSc: 0-No pain                 Durenda Hurt

## 2018-11-23 NOTE — Transfer of Care (Signed)
Immediate Anesthesia Transfer of Care Note  Patient: Francisco Roman  Procedure(s) Performed: COLONOSCOPY WITH PROPOFOL (N/A )  Patient Location: Endoscopy Unit  Anesthesia Type:General  Level of Consciousness: awake, alert  and oriented  Airway & Oxygen Therapy: Patient Spontanous Breathing and Patient connected to face mask oxygen  Post-op Assessment: Report given to RN and Post -op Vital signs reviewed and stable  Post vital signs: Reviewed and stable  Last Vitals:  Vitals Value Taken Time  BP    Temp    Pulse    Resp    SpO2      Last Pain:  Vitals:   11/23/18 0707  TempSrc: Tympanic  PainSc: 0-No pain         Complications: No apparent anesthesia complications

## 2018-11-23 NOTE — Anesthesia Preprocedure Evaluation (Addendum)
Anesthesia Evaluation  Patient identified by MRN, date of birth, ID band Patient awake    Reviewed: Allergy & Precautions, H&P , NPO status , Patient's Chart, lab work & pertinent test results  Airway Mallampati: III       Dental  (+) Teeth Intact   Pulmonary sleep apnea and Continuous Positive Airway Pressure Ventilation , neg COPD, Recent URI  (flu one month ago), Resolved,           Cardiovascular (-) angina(-) Past MI negative cardio ROS  (-) dysrhythmias      Neuro/Psych negative neurological ROS  negative psych ROS   GI/Hepatic Neg liver ROS, GERD  Controlled,  Endo/Other  Morbid obesity  Renal/GU negative Renal ROS  negative genitourinary   Musculoskeletal   Abdominal   Peds  Hematology negative hematology ROS (+)   Anesthesia Other Findings Past Medical History: 2010: Diverticulitis     Comment:  with small perforation No date: GERD (gastroesophageal reflux disease) No date: Gout  Past Surgical History: 1986: HERNIA REPAIR; Left December 2016: HERNIA REPAIR     Comment:  Umbilical, 6.4 cm Ventralex ST mesh 19/37/9024: UMBILICAL HERNIA REPAIR; N/A     Comment:  Procedure: HERNIA REPAIR UMBILICAL ADULT;  Surgeon:               Robert Bellow, MD;  Location: [facility] ORS;  Service:               General;  Laterality: N/A;     Reproductive/Obstetrics negative OB ROS                            Anesthesia Physical Anesthesia Plan  ASA: III  Anesthesia Plan: General   Post-op Pain Management:    Induction:   PONV Risk Score and Plan: Propofol infusion and TIVA  Airway Management Planned: Simple Face Mask  Additional Equipment:   Intra-op Plan:   Post-operative Plan:   Informed Consent: I have reviewed the patients History and Physical, chart, labs and discussed the procedure including the risks, benefits and alternatives for the proposed anesthesia with the patient  or authorized representative who has indicated his/her understanding and acceptance.     Dental Advisory Given  Plan Discussed with: Anesthesiologist and CRNA  Anesthesia Plan Comments:        Anesthesia Quick Evaluation

## 2018-11-23 NOTE — Anesthesia Post-op Follow-up Note (Signed)
Anesthesia QCDR form completed.        

## 2018-11-23 NOTE — Op Note (Signed)
[facility] Patient Name: Francisco Roman Procedure Date: 11/23/2018 7:33 AM MRN: [id number] Account #: [id number] Date of Birth: 07-03-1966 Admit Type: Outpatient Age: 53 Room: [facility] ENDO ROOM 3 Gender: Male Note Status: Finalized Procedure:            Colonoscopy Indications:          Screening for colorectal malignant neoplasm Providers:            Lollie Sails, MD Medicines:            Monitored Anesthesia Care Complications:        No immediate complications. Procedure:            Pre-Anesthesia Assessment:                       - ASA Grade Assessment: III - A patient with severe                        systemic disease.                       After obtaining informed consent, the colonoscope was                        passed under direct vision. Throughout the procedure,                        the patient's blood pressure, pulse, and oxygen                        saturations were monitored continuously. The                        Colonoscope was introduced through the anus and                        advanced to the the cecum, identified by appendiceal                        orifice and ileocecal valve. The quality of the bowel                        preparation was good except the ascending colon was                        fair. Findings:      Multiple small to medium diverticula were found in the sigmoid colon and       distal descending colon.      Non-bleeding internal hemorrhoids were found during retroflexion and       during anoscopy. The hemorrhoids were small and Grade I (internal       hemorrhoids that do not prolapse).      The exam was otherwise without abnormality. Impression:           - Diverticulosis in the sigmoid colon and in the distal                        descending colon.                       - Non-bleeding internal hemorrhoids.                       -  The examination was otherwise normal.                       -  No specimens collected. Recommendation:       - Discharge patient to home.                       - Continue present medications.                       - Repeat colonoscopy in 10 years for screening purposes. Lollie Sails, MD 11/23/2018 8:07:17 AM This report has been signed electronically. Number of Addenda: 0 Note Initiated On: 11/23/2018 7:33 AM Scope Withdrawal Time: 0 hours 9 minutes 22 seconds  Total Procedure Duration: 0 hours 21 minutes 28 seconds       [facility]

## 2018-11-24 ENCOUNTER — Encounter: Payer: Self-pay | Admitting: Gastroenterology

## 2020-05-19 ENCOUNTER — Other Ambulatory Visit: Payer: Self-pay | Admitting: Student

## 2020-05-19 DIAGNOSIS — S46012A Strain of muscle(s) and tendon(s) of the rotator cuff of left shoulder, initial encounter: Secondary | ICD-10-CM

## 2020-05-19 DIAGNOSIS — M25512 Pain in left shoulder: Secondary | ICD-10-CM

## 2020-05-19 DIAGNOSIS — G8929 Other chronic pain: Secondary | ICD-10-CM

## 2020-06-16 ENCOUNTER — Other Ambulatory Visit: Payer: Self-pay

## 2020-06-16 ENCOUNTER — Ambulatory Visit
Admission: RE | Admit: 2020-06-16 | Discharge: 2020-06-16 | Disposition: A | Discharge status: Home / Self Care | Payer: BC Managed Care – PPO | Source: Ambulatory Visit | Attending: Student | Admitting: Student

## 2020-06-16 ENCOUNTER — Ambulatory Visit: Payer: BLUE CROSS/BLUE SHIELD

## 2020-06-16 DIAGNOSIS — G8929 Other chronic pain: Secondary | ICD-10-CM | POA: Diagnosis present

## 2020-06-16 DIAGNOSIS — M25512 Pain in left shoulder: Secondary | ICD-10-CM | POA: Diagnosis not present

## 2020-06-16 DIAGNOSIS — S46012A Strain of muscle(s) and tendon(s) of the rotator cuff of left shoulder, initial encounter: Secondary | ICD-10-CM | POA: Diagnosis present

## 2021-07-09 ENCOUNTER — Ambulatory Visit: Payer: BC Managed Care – PPO

## 2021-07-09 ENCOUNTER — Emergency Department: Payer: BC Managed Care – PPO

## 2021-07-09 ENCOUNTER — Other Ambulatory Visit: Payer: Self-pay | Admitting: Family Medicine

## 2021-07-09 ENCOUNTER — Inpatient Hospital Stay
Admission: EM | Admit: 2021-07-09 | Discharge: 2021-07-12 | DRG: 872 | Disposition: A | Discharge status: Home / Self Care | Payer: BC Managed Care – PPO | Attending: Internal Medicine | Admitting: Internal Medicine

## 2021-07-09 ENCOUNTER — Other Ambulatory Visit: Payer: Self-pay

## 2021-07-09 ENCOUNTER — Encounter: Payer: Self-pay | Admitting: Internal Medicine

## 2021-07-09 DIAGNOSIS — D72829 Elevated white blood cell count, unspecified: Secondary | ICD-10-CM | POA: Diagnosis present

## 2021-07-09 DIAGNOSIS — N453 Epididymo-orchitis: Secondary | ICD-10-CM | POA: Diagnosis present

## 2021-07-09 DIAGNOSIS — Z20822 Contact with and (suspected) exposure to covid-19: Secondary | ICD-10-CM | POA: Diagnosis present

## 2021-07-09 DIAGNOSIS — N390 Urinary tract infection, site not specified: Secondary | ICD-10-CM | POA: Diagnosis present

## 2021-07-09 DIAGNOSIS — N451 Epididymitis: Secondary | ICD-10-CM

## 2021-07-09 DIAGNOSIS — N50811 Right testicular pain: Secondary | ICD-10-CM

## 2021-07-09 DIAGNOSIS — M109 Gout, unspecified: Secondary | ICD-10-CM | POA: Diagnosis present

## 2021-07-09 DIAGNOSIS — Z7982 Long term (current) use of aspirin: Secondary | ICD-10-CM

## 2021-07-09 DIAGNOSIS — N433 Hydrocele, unspecified: Secondary | ICD-10-CM | POA: Diagnosis present

## 2021-07-09 DIAGNOSIS — N5082 Scrotal pain: Secondary | ICD-10-CM

## 2021-07-09 DIAGNOSIS — K5792 Diverticulitis of intestine, part unspecified, without perforation or abscess without bleeding: Secondary | ICD-10-CM | POA: Insufficient documentation

## 2021-07-09 DIAGNOSIS — Z79899 Other long term (current) drug therapy: Secondary | ICD-10-CM

## 2021-07-09 DIAGNOSIS — R6520 Severe sepsis without septic shock: Secondary | ICD-10-CM | POA: Diagnosis present

## 2021-07-09 DIAGNOSIS — Z885 Allergy status to narcotic agent status: Secondary | ICD-10-CM

## 2021-07-09 DIAGNOSIS — A419 Sepsis, unspecified organism: Secondary | ICD-10-CM | POA: Diagnosis not present

## 2021-07-09 DIAGNOSIS — K219 Gastro-esophageal reflux disease without esophagitis: Secondary | ICD-10-CM | POA: Diagnosis present

## 2021-07-09 LAB — RESP PANEL BY RT-PCR (FLU A&B, COVID) ARPGX2
Influenza A by PCR: NEGATIVE
Influenza B by PCR: NEGATIVE
SARS Coronavirus 2 by RT PCR: NEGATIVE

## 2021-07-09 LAB — CBC WITH DIFFERENTIAL/PLATELET
Abs Immature Granulocytes: 0.05 10*3/uL (ref 0.00–0.07)
Basophils Absolute: 0.1 10*3/uL (ref 0.0–0.1)
Basophils Relative: 1 %
Eosinophils Absolute: 0 10*3/uL (ref 0.0–0.5)
Eosinophils Relative: 0 %
HCT: 41.7 % (ref 39.0–52.0)
Hemoglobin: 14.8 g/dL (ref 13.0–17.0)
Immature Granulocytes: 0 %
Lymphocytes Relative: 13 %
Lymphs Abs: 1.6 10*3/uL (ref 0.7–4.0)
MCH: 34.3 pg — ABNORMAL HIGH (ref 26.0–34.0)
MCHC: 35.5 g/dL (ref 30.0–36.0)
MCV: 96.5 fL (ref 80.0–100.0)
Monocytes Absolute: 0.3 10*3/uL (ref 0.1–1.0)
Monocytes Relative: 2 %
Neutro Abs: 10 10*3/uL — ABNORMAL HIGH (ref 1.7–7.7)
Neutrophils Relative %: 84 %
Platelets: 238 10*3/uL (ref 150–400)
RBC: 4.32 MIL/uL (ref 4.22–5.81)
RDW: 11.9 % (ref 11.5–15.5)
WBC: 12 10*3/uL — ABNORMAL HIGH (ref 4.0–10.5)
nRBC: 0 % (ref 0.0–0.2)

## 2021-07-09 LAB — COMPREHENSIVE METABOLIC PANEL
ALT: 27 U/L (ref 0–44)
AST: 29 U/L (ref 15–41)
Albumin: 3.7 g/dL (ref 3.5–5.0)
Alkaline Phosphatase: 72 U/L (ref 38–126)
Anion gap: 14 (ref 5–15)
BUN: 16 mg/dL (ref 6–20)
CO2: 22 mmol/L (ref 22–32)
Calcium: 8.9 mg/dL (ref 8.9–10.3)
Chloride: 99 mmol/L (ref 98–111)
Creatinine, Ser: 1.15 mg/dL (ref 0.61–1.24)
GFR, Estimated: >60 mL/min (ref >60)
Glucose, Bld: 121 mg/dL — ABNORMAL HIGH (ref 70–99)
Potassium: 3.8 mmol/L (ref 3.5–5.1)
Sodium: 135 mmol/L (ref 135–145)
Total Bilirubin: 1.2 mg/dL (ref 0.3–1.2)
Total Protein: 8 g/dL (ref 6.5–8.1)

## 2021-07-09 LAB — URINALYSIS, COMPLETE (UACMP) WITH MICROSCOPIC
Bacteria, UA: NONE SEEN
Bilirubin Urine: NEGATIVE
Glucose, UA: NEGATIVE mg/dL
Ketones, ur: 20 mg/dL — AB
Nitrite: NEGATIVE
Protein, ur: 100 mg/dL — AB
Specific Gravity, Urine: 1.04 — ABNORMAL HIGH (ref 1.005–1.030)
WBC, UA: >50 WBC/hpf — ABNORMAL HIGH (ref 0–5)
pH: 7 (ref 5.0–8.0)

## 2021-07-09 LAB — APTT: aPTT: 25 seconds (ref 24–36)

## 2021-07-09 LAB — PROTIME-INR
INR: 1.1 (ref 0.8–1.2)
Prothrombin Time: 14.1 seconds (ref 11.4–15.2)

## 2021-07-09 LAB — CHLAMYDIA/NGC RT PCR (ARMC ONLY)
Chlamydia Tr: NOT DETECTED
N gonorrhoeae: NOT DETECTED

## 2021-07-09 LAB — LACTIC ACID, PLASMA
Lactic Acid, Venous: 3.4 mmol/L (ref 0.5–1.9)
Lactic Acid, Venous: 1.3 mmol/L (ref 0.5–1.9)

## 2021-07-09 MED ORDER — ONDANSETRON HCL 4 MG/2ML IJ SOLN
4.0000 mg | Freq: Once | INTRAMUSCULAR | Status: AC
  Administered 2021-07-09: 4 mg via INTRAVENOUS
  Filled 2021-07-09: qty 2

## 2021-07-09 MED ORDER — MORPHINE SULFATE (PF) 2 MG/ML IV SOLN: 2.0000 mg | INTRAVENOUS | Status: DC | PRN

## 2021-07-09 MED ORDER — ONDANSETRON HCL 4 MG PO TABS
4.0000 mg | ORAL_TABLET | Freq: Four times a day (QID) | ORAL | Status: DC | PRN
Start: 2021-07-09 — End: 2021-07-12

## 2021-07-09 MED ORDER — SODIUM CHLORIDE 0.9 % IV SOLN
2.0000 g | INTRAVENOUS | Status: DC
  Administered 2021-07-10: 15:00:00 2 g via INTRAVENOUS
  Filled 2021-07-09: qty 20
  Filled 2021-07-09: qty 2

## 2021-07-09 MED ORDER — ACETAMINOPHEN 325 MG PO TABS
650.0000 mg | ORAL_TABLET | Freq: Four times a day (QID) | ORAL | Status: AC | PRN
  Administered 2021-07-10 (×3): 650 mg via ORAL
  Filled 2021-07-09 (×3): qty 2

## 2021-07-09 MED ORDER — OXYMETAZOLINE HCL 0.05 % NA SOLN
1.0000 | Freq: Two times a day (BID) | NASAL | Status: AC | PRN
  Administered 2021-07-09: 17:00:00 1 via NASAL
  Filled 2021-07-09: qty 15

## 2021-07-09 MED ORDER — FLUTICASONE PROPIONATE 50 MCG/ACT NA SUSP
1.0000 | Freq: Every day | NASAL | Status: DC
  Administered 2021-07-11 – 2021-07-12 (×2): 1 via NASAL
  Filled 2021-07-09 (×2): qty 16

## 2021-07-09 MED ORDER — SODIUM CHLORIDE 0.9 % IV SOLN
1000.0000 mL | Freq: Once | INTRAVENOUS | Status: AC
  Administered 2021-07-09: 1000 mL via INTRAVENOUS

## 2021-07-09 MED ORDER — MORPHINE SULFATE (PF) 2 MG/ML IV SOLN: 1.0000 mg | INTRAVENOUS | Status: DC | PRN

## 2021-07-09 MED ORDER — ALLOPURINOL 100 MG PO TABS
300.0000 mg | ORAL_TABLET | Freq: Every day | ORAL | Status: DC
  Administered 2021-07-10 – 2021-07-12 (×3): 300 mg via ORAL
  Filled 2021-07-09 (×4): qty 3

## 2021-07-09 MED ORDER — ADULT MULTIVITAMIN W/MINERALS CH
1.0000 | ORAL_TABLET | Freq: Every day | ORAL | Status: DC
  Administered 2021-07-10 – 2021-07-12 (×3): 1 via ORAL
  Filled 2021-07-09 (×4): qty 1

## 2021-07-09 MED ORDER — SODIUM CHLORIDE 0.9 % IV SOLN: 1.0000 g | Freq: Once | INTRAVENOUS | Status: DC

## 2021-07-09 MED ORDER — KETOROLAC TROMETHAMINE 15 MG/ML IJ SOLN
15.0000 mg | Freq: Four times a day (QID) | INTRAMUSCULAR | Status: AC | PRN
  Filled 2021-07-09: qty 1

## 2021-07-09 MED ORDER — ENOXAPARIN SODIUM 60 MG/0.6ML IJ SOSY
0.5000 mg/kg | PREFILLED_SYRINGE | Freq: Every day | INTRAMUSCULAR | Status: DC
  Filled 2021-07-09: qty 0.6

## 2021-07-09 MED ORDER — IOHEXOL 300 MG/ML  SOLN
100.0000 mL | Freq: Once | INTRAMUSCULAR | Status: AC | PRN
  Administered 2021-07-09: 100 mL via INTRAVENOUS

## 2021-07-09 MED ORDER — ONDANSETRON HCL 4 MG/2ML IJ SOLN
4.0000 mg | Freq: Four times a day (QID) | INTRAMUSCULAR | Status: DC | PRN
  Administered 2021-07-11: 4 mg via INTRAVENOUS
  Filled 2021-07-09: qty 2

## 2021-07-09 MED ORDER — SODIUM CHLORIDE 0.9 % IV SOLN
1.0000 g | Freq: Once | INTRAVENOUS | Status: AC
  Administered 2021-07-09: 1 g via INTRAVENOUS
  Filled 2021-07-09: qty 10

## 2021-07-09 MED ORDER — MULTIVITAMINS PO CAPS: 1.0000 | ORAL_CAPSULE | Freq: Every day | ORAL | Status: DC

## 2021-07-09 MED ORDER — ACETAMINOPHEN 650 MG RE SUPP: 650.0000 mg | Freq: Four times a day (QID) | RECTAL | Status: AC | PRN

## 2021-07-09 MED ORDER — FLUTICASONE PROPIONATE 50 MCG/ACT NA SUSP
1.0000 | Freq: Every day | NASAL | Status: DC
  Administered 2021-07-09: 1 via NASAL
  Filled 2021-07-09: qty 16

## 2021-07-09 MED ORDER — SODIUM CHLORIDE 0.9 % IV SOLN
12.5000 mg | Freq: Four times a day (QID) | INTRAVENOUS | Status: DC | PRN
  Administered 2021-07-09: 12.5 mg via INTRAVENOUS
  Filled 2021-07-09 (×2): qty 0.5

## 2021-07-09 MED ORDER — ACETYLCYSTEINE 10 % IN SOLN
2.0000 mL | Freq: Two times a day (BID) | RESPIRATORY_TRACT | Status: DC
  Filled 2021-07-09 (×2): qty 4

## 2021-07-09 MED ORDER — MORPHINE SULFATE (PF) 4 MG/ML IV SOLN
4.0000 mg | Freq: Once | INTRAVENOUS | Status: AC
Start: 2021-07-09 — End: 2021-07-09
  Administered 2021-07-09: 4 mg via INTRAVENOUS
  Filled 2021-07-09: qty 1

## 2021-07-09 MED ORDER — ALBUTEROL SULFATE (2.5 MG/3ML) 0.083% IN NEBU: 2.5000 mg | INHALATION_SOLUTION | Freq: Two times a day (BID) | RESPIRATORY_TRACT | Status: DC

## 2021-07-09 MED ORDER — MELATONIN 5 MG PO TABS
5.0000 mg | ORAL_TABLET | Freq: Every evening | ORAL | Status: DC | PRN
  Administered 2021-07-09 – 2021-07-11 (×3): 5 mg via ORAL
  Filled 2021-07-09 (×4): qty 1

## 2021-07-09 NOTE — ED Notes (Signed)
RN to bedside to answer call bell. Pt covered in sweat. Pt states he feels sick on his stomach.

## 2021-07-09 NOTE — H&P (Addendum)
History and Physical   Francisco Roman SFK:[id number] DOB: 11-17-1965 DOA: 07/09/2021  PCP: Maryland Pink, MD  Outpatient Specialists: Dr. Roland Rack, orthopedic surgeon Patient coming from: home   I have personally briefly reviewed patient's old medical records in [place].  Chief Concern:   HPI: Francisco Roman is a 55 y.o. male with medical history significant for gout currently on allopurinol, history of diverticulitis, presence of diverticulosis, who presents emergency department from home for chief concerns of scrotal swelling.  On Wednesday, 07/07/21 while he was walking to his truck after installing a thermostat, he noticed a sharp and stabbing at his right scrotum,5/10. This lasted a few moments and it went away. The following day, he developed swelling of his scrotum initially to the size of a tennis ball and then to a baseball.    He is sexually active and last time he had intercourse was Saturday, 07/03/2021, and it was vigorous. Patient and spouse denies unusual sexual activity.  Last week, he had one day of dysuria, with increased frequency, and this lasted one day.   At bedside he is able to tell me his name, age, current location, current calendar year.  He endorses that the scrotal pain has improved significantly.  He states that the nausea is bothering him the most at this time.  He reports that Zofran gave him minimal improvement.  Social history: He lives at home with spouse. He works in [employer]. He denies tobacco use.   ROS: Constitutional: no weight change, no fever ENT/Mouth: no sore throat, no rhinorrhea Eyes: no eye pain, no vision changes Cardiovascular: no chest pain, + dyspnea,  no edema, no palpitations Respiratory: no cough, no sputum, no wheezing Gastrointestinal: + nausea, no vomiting, no diarrhea, no constipation Genitourinary: no urinary incontinence, + dysuria, no hematuria, + urgency Musculoskeletal: no arthralgias, no  myalgias Skin: no skin lesions, no pruritus, Neuro: + weakness, no loss of consciousness, no syncope Psych: no anxiety, no depression, + decrease appetite Heme/Lymph: no bruising, no bleeding  ED Course: Discussed with emergency medicine provider, patient requiring hospitalization for chief concerns of epididymoorchitis.  Vitals in the emergency department was remarkable for temperature of 99, respiration rate of 15, heart rate of 99, initial blood pressure 110/58 and improved to 120/72, SPO2 of 97% on room air.  Labs in the emergency department was remarkable for sodium 135, potassium 3.8, chloride 99, bicarb 22, BUN of 16, serum creatinine of 1.15, nonfasting blood glucose 121, WBC of 12, hemoglobin of 14.8, platelets of 238.  GFR of greater than 60.  Lactic acid is 3.4.  And improved to 1.5.  COVID/influenza A/influenza B PCR were negative.  In the emergency department patient was given ceftriaxone 1 g IV once, sodium chloride 1 L bolus, ondansetron 4 mg x 2, morphine 4 mg IV.  Assessment/Plan  Principal Problem:   Epididymitis Active Problems:   Gout   Diverticulitis   Leukocytosis   # Patient meets sepsis criteria in the very definition, elevated lactic acid, increased respiration rate, elevated WBC, source as below # Low clinical suspicion for torsion scrotum was manipulated without severe pain # Epididymoorchitis-failed outpatient therapy - With source suspected to be epididymoorchitis - Check serum HIV and urine chlamydia and Neisseria gonorrhea  - Status post ceftriaxone 1 g IV per EDP - We will continue with ceftriaxone at 2 g IV starting on 07/10/2021 - EDP ordered blood cultures, 1 set - Pending ultrasound of the scrotum read - Supportive measures:  Ketorolac 15 mg IV every 6 hours.  For moderate pain, 1 day ordered; morphine 2 mg IV every 4 hours, prn for severe pain, 3 doses ordered; ondansetron 4 mg p.o. or IV every 6 hours as needed for nausea and vomiting -  Extensive discussion with patient and spouse at bedside that he will need outpatient ultrasound in 6 to 8 weeks to exclude epididymal mass - Admit to MedSurg, observation, no telemetry  # UTI - POA, urine culture is in-process; treat as above   # Congestion-status post Flonase with minimal improvement - Afrin, 1 spray each nare, twice daily as needed for congestion; Mucomyst 2 mL nebulizer, twice daily, 3 doses ordered  # History of gout-currently in remission - Allopurinol 300 mg daily resumed for 07/10/2021  Chart reviewed.   DVT prophylaxis: Enoxaparin nightly starting on 07/10/2021 Code Status: full code  Diet: Regular diet Family Communication: updated spouse at bedside Disposition Plan: Pending clinical course Consults called: None at this time Admission status: MedSurg, observation, no telemetry  Past Medical History:  Diagnosis Date   Diverticulitis 2010   with small perforation   GERD (gastroesophageal reflux disease)    Gout    Past Surgical History:  Procedure Laterality Date   COLONOSCOPY WITH PROPOFOL N/A 11/23/2018   Procedure: COLONOSCOPY WITH PROPOFOL;  Surgeon: Lollie Sails, MD;  Location: [facility] ENDOSCOPY;  Service: Endoscopy;  Laterality: N/A;   HERNIA REPAIR Left 1986   HERNIA REPAIR  December 0626   Umbilical, 6.4 cm Ventralex ST mesh   UMBILICAL HERNIA REPAIR N/A 09/09/2015   Procedure: HERNIA REPAIR UMBILICAL ADULT;  Surgeon: Robert Bellow, MD;  Location: [facility] ORS;  Service: General;  Laterality: N/A;   Social History:  reports that he has never smoked. He has never used smokeless tobacco. He reports current alcohol use of about 2.0 standard drinks per week. He reports that he does not use drugs.  Allergies  Allergen Reactions   Codeine Other (See Comments), Rash and Nausea And Vomiting   No family history on file. Family history: Family history reviewed and not pertinent  Prior to Admission medications   Medication Sig Start Date End Date  Taking? Authorizing Provider  allopurinol (ZYLOPRIM) 300 MG tablet Take 300 mg by mouth daily. 05/05/21  Yes [provider]  aspirin 81 MG EC tablet Take 1 tablet by mouth daily.   Yes [provider]  ciprofloxacin (CIPRO) 500 MG tablet Take 500 mg by mouth 2 (two) times daily. 07/08/21  Yes [provider]  cyanocobalamin 1000 MCG tablet Take 1 tablet by mouth daily.   Yes [provider]  fluticasone (FLONASE) 50 MCG/ACT nasal spray Place 1 spray into both nostrils daily.   Yes [provider]  ibuprofen (ADVIL,MOTRIN) 200 MG tablet Take 200 mg by mouth every 6 (six) hours as needed.   Yes [provider]  Multiple Vitamin (MULTIVITAMIN) capsule Take 1 capsule by mouth daily.   Yes [provider]  Omega-3 1000 MG CAPS Take 2 capsules by mouth daily.   Yes [provider]  polyethylene glycol (MIRALAX / GLYCOLAX) packet Take 17 g by mouth daily.    Yes [provider]  tetrahydrozoline 0.05 % ophthalmic solution Place 1 drop into both eyes daily as needed.   Yes [provider]   Physical Exam: Vitals:   07/09/21 1300 07/09/21 1330 07/09/21 1400 07/09/21 1500  BP: (!) 106/55 120/67 128/64 133/70  Pulse: 92 86 85 78  Resp: 14 18 (!)  23 20  Temp:      TempSrc:      SpO2: 94% 98% 97% 96%  Weight:      Height:       Constitutional: appears age-appropriate, NAD, calm, comfortable Eyes: PERRL, lids and conjunctivae normal ENMT: Mucous membranes are moist. Posterior pharynx clear of any exudate or lesions. Age-appropriate dentition. Hearing appropriate Neck: normal, supple, no masses, no thyromegaly Respiratory: clear to auscultation bilaterally, no wheezing, no crackles. Normal respiratory effort. No accessory muscle use.  Cardiovascular: Regular rate and rhythm, no murmurs / rubs / gallops. No extremity edema. 2+ pedal pulses. No carotid bruits.  Abdomen: Obese abdomen, no tenderness, no masses  palpated, no hepatosplenomegaly. Bowel sounds positive.  GU: Mild tenderness of the scrotum Musculoskeletal: no clubbing / cyanosis. No joint deformity upper and lower extremities. Good ROM, no contractures, no atrophy. Normal muscle tone.  Skin: no rashes, lesions, ulcers. No induration Neurologic: Sensation intact. Strength 5/5 in all 4.  Psychiatric: Normal judgment and insight. Alert and oriented x 3. Normal mood.   EKG: independently reviewed, showing   Chest x-ray on Admission: I personally reviewed and I agree with radiologist reading as below.  CT ABDOMEN PELVIS W CONTRAST  Result Date: 07/09/2021 CLINICAL DATA:  Sudden onset of chills and shaking with right scrotal enlargement. Clinical concern for complicated hernia. History of diverticulitis. EXAM: CT ABDOMEN AND PELVIS WITH CONTRAST TECHNIQUE: Multidetector CT imaging of the abdomen and pelvis was performed using the standard protocol following bolus administration of intravenous contrast. CONTRAST:  118mL OMNIPAQUE IOHEXOL 300 MG/ML  SOLN COMPARISON:  Abdominopelvic CT 09/22/2011. FINDINGS: Lower chest: Tiny nodules at the right lung base are unchanged from the remote CT, consistent with benign findings. The lung bases are otherwise clear. There is no pleural effusion or pneumothorax. Hepatobiliary: The previously demonstrated hepatic steatosis appears improved, and no focal abnormality or abnormal enhancement identified. No evidence of gallstones, gallbladder wall thickening or biliary dilatation. Pancreas: Unremarkable. No pancreatic ductal dilatation or surrounding inflammatory changes. Spleen: Normal in size without focal abnormality. Adrenals/Urinary Tract: Both adrenal glands appear normal. There is a small cyst posteriorly in the lower pole of the left kidney. Both kidneys otherwise appear unremarkable. No evidence of urinary tract calculus or hydronephrosis. The bladder appears unremarkable for its degree of distention.  Stomach/Bowel: No enteric contrast administered. The stomach appears unremarkable for its degree of distension. No evidence of bowel wall thickening, distention or surrounding inflammatory change. The appendix appears normal. There are mild diverticular changes of the descending and sigmoid colon. There is no herniated bowel. Vascular/Lymphatic: There are no enlarged abdominal or pelvic lymph nodes. No significant vascular findings. Reproductive: The prostate gland and seminal vesicles appear unremarkable. There are small bilateral scrotal hydroceles,, larger on the right. There is a possible extratesticular 1.4 cm low-density lesion inferiorly in the right scrotum, best seen on coronal image 37/5. Other: There is diastasis of the rectus abdominus muscles which appears unchanged. No inguinal or other abdominal wall hernia identified. There is no ascites or free air. Musculoskeletal: No acute or significant osseous findings. Lower lumbar spondylosis noted. IMPRESSION: 1. Bilateral hydroceles with small low-density extratesticular mass in the right scrotum. Recommend further evaluation with scrotal ultrasound. 2. No evidence of abdominal wall hernia, bowel obstruction or acute inflammation. 3. Improvement in previously demonstrated hepatic steatosis. 4. Distal colonic diverticulosis without acute inflammation. 5. Lumbar spondylosis. Electronically Signed   By: Richardean Sale M.D.   On: 07/09/2021 13:48   DG Chest [facility]  Result Date: 07/09/2021 CLINICAL DATA:  Questionable sepsis - evaluate for abnormality EXAM: PORTABLE CHEST 1 VIEW COMPARISON:  None. FINDINGS: The heart size and mediastinal contours are within normal limits. Both lungs are clear. No pleural effusion. The visualized skeletal structures are unremarkable. IMPRESSION: No acute process in the chest. Electronically Signed   By: Macy Mis M.D.   On: 07/09/2021 12:12   US SCROTUM W/DOPPLER  Result Date: 07/09/2021 CLINICAL DATA:   Scrotal pain for 3 days EXAM: SCROTAL ULTRASOUND DOPPLER ULTRASOUND OF THE TESTICLES TECHNIQUE: Complete ultrasound examination of the testicles, epididymis, and other scrotal structures was performed. Color and spectral Doppler ultrasound were also utilized to evaluate blood flow to the testicles. COMPARISON:  None. FINDINGS: Right testicle Measurements: 4.8 x 3.4 x 4.0 cm. No mass or microlithiasis visualized. Asymmetrically vascular. Left testicle Measurements: 5.2 x 2.7 x 3.3 cm. No mass or microlithiasis visualized. Right epididymis:  Enlarged, hypervascular, and heterogeneous. Left epididymis:  Normal in size and appearance. Hydrocele:  Small right hydrocele. Varicocele:  None visualized. Pulsed Doppler interrogation of both testes demonstrates normal low resistance arterial and venous waveforms bilaterally. IMPRESSION: Increased vascularity of the right testicle and epididymis with asymmetric enlargement and heterogeneity of the right epididymal tail most consistent with epididymal orchitis. Follow-up ultrasound should be performed in 6-8 weeks to exclude epididymal mass. Electronically Signed   By: Miachel Roux M.D.   On: 07/09/2021 15:06    Labs on Admission: I have personally reviewed following labs  CBC: Recent Labs  Lab 07/09/21 1128  WBC 12.0*  NEUTROABS 10.0*  HGB 14.8  HCT 41.7  MCV 96.5  PLT 194   Basic Metabolic Panel: Recent Labs  Lab 07/09/21 1128  NA 135  K 3.8  CL 99  CO2 22  GLUCOSE 121*  BUN 16  CREATININE 1.15  CALCIUM 8.9   GFR: Estimated Creatinine Clearance: 98 mL/min (by C-G formula based on SCr of 1.15 mg/dL).  Liver Function Tests: Recent Labs  Lab 07/09/21 1128  AST 29  ALT 27  ALKPHOS 72  BILITOT 1.2  PROT 8.0  ALBUMIN 3.7   Coagulation Profile: Recent Labs  Lab 07/09/21 1128  INR 1.1   Dr. Tobie Poet [facility]  If 7PM-7AM, please contact overnight-coverage provider If 7AM-7PM, please contact day coverage  provider [email or web address]  07/09/2021, 3:24 PM

## 2021-07-09 NOTE — ED Notes (Signed)
Culture sent one set but pt is on an antibiotic

## 2021-07-09 NOTE — ED Triage Notes (Signed)
Pt reports sudden onset of chills and shaking on the way to his u/s appt this am, states that his right testicle is the size of a baseball, pain with having bm's, states that he has taken 3 doses of antibiotics for a possible bacterial infection

## 2021-07-09 NOTE — ED Provider Notes (Signed)
[facility] Emergency Department Provider Note   ____________________________________________    I have reviewed the triage vital signs and the nursing notes.   HISTORY  Chief Complaint Chills and Shortness of Breath     HPI Francisco Roman is a 55 y.o. male who presents with complaints of chills and shortness of breath.  Patient reports he was on his way to the hospital for an urgent ultrasound of his scrotum.  Yesterday while at work he developed swelling in his right scrotum with discomfort.  Saw his PCP yesterday, was prescribed antibiotics, has taken 2 doses for this.  While in route to the hospital developed significant chills, worsening shortness of breath/hyperventilating.  Brought to the emergency department.  He denies chest pain, no abdominal pain, mild right scrotal discomfort.  No cough or COVID symptoms  Past Medical History:  Diagnosis Date   Diverticulitis 2010   with small perforation   GERD (gastroesophageal reflux disease)    Gout     Patient Active Problem List   Diagnosis Date Noted   Umbilical hernia without obstruction and without gangrene 07/29/2015    Past Surgical History:  Procedure Laterality Date   COLONOSCOPY WITH PROPOFOL N/A 11/23/2018   Procedure: COLONOSCOPY WITH PROPOFOL;  Surgeon: Lollie Sails, MD;  Location: [facility] ENDOSCOPY;  Service: Endoscopy;  Laterality: N/A;   HERNIA REPAIR Left 1986   HERNIA REPAIR  December 6767   Umbilical, 6.4 cm Ventralex ST mesh   UMBILICAL HERNIA REPAIR N/A 09/09/2015   Procedure: HERNIA REPAIR UMBILICAL ADULT;  Surgeon: Aydeen Blume Bellow, MD;  Location: [facility] ORS;  Service: General;  Laterality: N/A;    Prior to Admission medications   Medication Sig Start Date End Date Taking? Authorizing Provider  allopurinol (ZYLOPRIM) 300 MG tablet Take 300 mg by mouth daily. 05/05/21  Yes [provider]  aspirin 81 MG EC tablet Take 1 tablet by mouth daily.   Yes [provider]  ciprofloxacin (CIPRO) 500 MG tablet Take 500 mg by mouth 2 (two) times daily. 07/08/21  Yes [provider]  cyanocobalamin 1000 MCG tablet Take 1 tablet by mouth daily.   Yes [provider]  fluticasone (FLONASE) 50 MCG/ACT nasal spray Place 1 spray into both nostrils daily.   Yes [provider]  ibuprofen (ADVIL,MOTRIN) 200 MG tablet Take 200 mg by mouth every 6 (six) hours as needed.   Yes [provider]  Multiple Vitamin (MULTIVITAMIN) capsule Take 1 capsule by mouth daily.   Yes [provider]  Omega-3 1000 MG CAPS Take 2 capsules by mouth daily.   Yes [provider]  polyethylene glycol (MIRALAX / GLYCOLAX) packet Take 17 g by mouth daily.    Yes [provider]  tetrahydrozoline 0.05 % ophthalmic solution Place 1 drop into both eyes daily as needed.   Yes [provider]     Allergies Codeine  No family history on file.  Social History Social History   Tobacco Use   Smoking status: Never   Smokeless tobacco: Never  Substance Use Topics   Alcohol use: Yes    Alcohol/week: 2.0 standard drinks    Types: 2 Cans of beer per week    Comment: 12/week -WEEKEND DRINKER PER PT   Drug use: No    Review of Systems  Constitutional: No fever/chills Eyes: No visual changes.  ENT: No sore throat. Cardiovascular: Denies chest pain. Respiratory: As above Gastrointestinal: No abdominal pain, no vomiting, discomfort with  defecating Genitourinary: Right scrotal swelling Musculoskeletal: Negative for back pain. Skin: Negative for rash. Neurological: Negative for headaches or weakness   ____________________________________________   PHYSICAL EXAM:  VITAL SIGNS: ED Triage Vitals  Enc Vitals Group     BP 07/09/21 1112 110/67     Pulse Rate 07/09/21 1112 (!) 117     Resp 07/09/21 1112 16     Temp 07/09/21 1112 99 F (37.2 C)     Temp Source 07/09/21 1112 Oral     SpO2 07/09/21 1112 100  %     Weight 07/09/21 1113 120.2 kg (265 lb)     Height 07/09/21 1113 1.803 m (5\' 11" )     Head Circumference --      Peak Flow --      Pain Score 07/09/21 1112 10     Pain Loc --      Pain Edu? --      Excl. in [place]? --     Constitutional: Alert and oriented.  In distress, Eyes: Conjunctivae are normal.  Head: Atraumatic. Nose: No congestion/rhinnorhea. Mouth/Throat: Mucous membranes are moist.   Neck:  Painless ROM Cardiovascular: Tachycardic regular rhythm. Grossly normal heart sounds.  Good peripheral circulation. Respiratory: Increased respiratory effort with tachypnea, no retractions, clear to auscultation Gastrointestinal: Soft and nontender. No distention.  No CVA tenderness.  Reassuring exam Genitourinary: Right scrotal swelling, mild tenderness to palpation, mild erythema, no crepitus or perineal involvement Musculoskeletal: No lower extremity tenderness nor edema.  Warm and well perfused Neurologic:  Normal speech and language. No gross focal neurologic deficits are appreciated.  Skin:  Skin is warm, dry and intact. No rash noted. Psychiatric: Mood and affect are normal. Speech and behavior are normal.  ____________________________________________   LABS (all labs ordered are listed, but only abnormal results are displayed)  Labs Reviewed  LACTIC ACID, PLASMA - Abnormal; Notable for the following components:      Result Value   Lactic Acid, Venous 3.4 (*)    All other components within normal limits  COMPREHENSIVE METABOLIC PANEL - Abnormal; Notable for the following components:   Glucose, Bld 121 (*)    All other components within normal limits  CBC WITH DIFFERENTIAL/PLATELET - Abnormal; Notable for the following components:   WBC 12.0 (*)    MCH 34.3 (*)    Neutro Abs 10.0 (*)    All other components within normal limits  RESP PANEL BY RT-PCR (FLU A&B, COVID) ARPGX2  CULTURE, BLOOD (SINGLE)  URINE CULTURE  LACTIC ACID, PLASMA  PROTIME-INR  APTT   URINALYSIS, COMPLETE (UACMP) WITH MICROSCOPIC   ____________________________________________  EKG  ED ECG REPORT I, Lavonia Drafts, the attending physician, personally viewed and interpreted this ECG.  Date: 07/09/2021  Rhythm: normal sinus rhythm QRS Axis: normal Intervals: normal ST/T Wave abnormalities: normal Narrative Interpretation: no evidence of acute ischemia  ____________________________________________  RADIOLOGY  Chest x-ray reviewed by me, no acute abnormality CT abdomen pelvis ____________________________________________   PROCEDURES  Procedure(s) performed: No  Procedures   Critical Care performed: No ____________________________________________   INITIAL IMPRESSION / ASSESSMENT AND PLAN / ED COURSE  Pertinent labs & imaging results that were available during my care of the patient were reviewed by me and considered in my medical decision making (see chart for details).   Patient presents with shortness of breath in the setting of recent development of right scrotal swelling and redness.  Patient reports scrotal swelling developed yesterday while at work.  He was seen by PCP and treated  for presumed orchitis.  Question whether patient possibly having indirect hernia given rapid onset of right testicular swelling versus torsion versus sepsis  Differential includes pneumonia, UTI, sepsis, incarcerated hernia  Labs including lactic, blood cultures, urine ordered, chest x-ray pending, will obtain CT abdomen pelvis as well  IV morphine and IV Zofran ordered  Notified of elevated lactic acid, will start IV Rocephin, still pending urinalysis  CT abdomen pelvis without acute abnormality, pending ultrasound scrotum/Dopplers  ----------------------------------------- 2:45 PM on 07/09/2021 ----------------------------------------- Discussed with ultrasound technologist, good flow, suspect epididymitis possibly causing systemic illness/sepsis we will  discussed with the hospitalist for admission    ____________________________________________   FINAL CLINICAL IMPRESSION(S) / ED DIAGNOSES  Final diagnoses:  Scrotal pain  Sepsis without acute organ dysfunction, due to unspecified organism [facility])        Note:  This document was prepared using Dragon voice recognition software and may include unintentional dictation errors.    Lavonia Drafts, MD 07/09/21 1447

## 2021-07-09 NOTE — Consult Note (Signed)
CODE SEPSIS - PHARMACY COMMUNICATION  **Broad Spectrum Antibiotics should be administered within 1 hour of Sepsis diagnosis**  Time Code Sepsis Called/Page Received: 7096  Antibiotics Ordered: ceftriaxone   Time of 1st antibiotic administration: [place], PharmD Pharmacy Resident  07/09/2021 3:07 PM

## 2021-07-10 DIAGNOSIS — R6520 Severe sepsis without septic shock: Secondary | ICD-10-CM | POA: Diagnosis present

## 2021-07-10 DIAGNOSIS — N390 Urinary tract infection, site not specified: Secondary | ICD-10-CM

## 2021-07-10 DIAGNOSIS — M109 Gout, unspecified: Secondary | ICD-10-CM | POA: Diagnosis present

## 2021-07-10 DIAGNOSIS — A419 Sepsis, unspecified organism: Secondary | ICD-10-CM | POA: Diagnosis present

## 2021-07-10 DIAGNOSIS — Z20822 Contact with and (suspected) exposure to covid-19: Secondary | ICD-10-CM | POA: Diagnosis present

## 2021-07-10 DIAGNOSIS — Z79899 Other long term (current) drug therapy: Secondary | ICD-10-CM | POA: Diagnosis not present

## 2021-07-10 DIAGNOSIS — N453 Epididymo-orchitis: Secondary | ICD-10-CM | POA: Diagnosis present

## 2021-07-10 DIAGNOSIS — K219 Gastro-esophageal reflux disease without esophagitis: Secondary | ICD-10-CM | POA: Diagnosis present

## 2021-07-10 DIAGNOSIS — N433 Hydrocele, unspecified: Secondary | ICD-10-CM | POA: Diagnosis present

## 2021-07-10 DIAGNOSIS — Z885 Allergy status to narcotic agent status: Secondary | ICD-10-CM | POA: Diagnosis not present

## 2021-07-10 DIAGNOSIS — Z7982 Long term (current) use of aspirin: Secondary | ICD-10-CM | POA: Diagnosis not present

## 2021-07-10 DIAGNOSIS — N451 Epididymitis: Secondary | ICD-10-CM | POA: Diagnosis present

## 2021-07-10 LAB — BASIC METABOLIC PANEL
Anion gap: 5 (ref 5–15)
BUN: 16 mg/dL (ref 6–20)
CO2: 26 mmol/L (ref 22–32)
Calcium: 8.6 mg/dL — ABNORMAL LOW (ref 8.9–10.3)
Chloride: 105 mmol/L (ref 98–111)
Creatinine, Ser: 0.95 mg/dL (ref 0.61–1.24)
GFR, Estimated: >60 mL/min (ref >60)
Glucose, Bld: 107 mg/dL — ABNORMAL HIGH (ref 70–99)
Potassium: 4.1 mmol/L (ref 3.5–5.1)
Sodium: 136 mmol/L (ref 135–145)

## 2021-07-10 LAB — CBC
HCT: 41.8 % (ref 39.0–52.0)
Hemoglobin: 14.3 g/dL (ref 13.0–17.0)
MCH: 33.7 pg (ref 26.0–34.0)
MCHC: 34.2 g/dL (ref 30.0–36.0)
MCV: 98.6 fL (ref 80.0–100.0)
Platelets: 242 10*3/uL (ref 150–400)
RBC: 4.24 MIL/uL (ref 4.22–5.81)
RDW: 11.9 % (ref 11.5–15.5)
WBC: 11.4 10*3/uL — ABNORMAL HIGH (ref 4.0–10.5)
nRBC: 0 % (ref 0.0–0.2)

## 2021-07-10 LAB — HIV ANTIBODY (ROUTINE TESTING W REFLEX): HIV Screen 4th Generation wRfx: NONREACTIVE

## 2021-07-10 MED ORDER — DOXYCYCLINE HYCLATE 100 MG PO TABS
100.0000 mg | ORAL_TABLET | Freq: Two times a day (BID) | ORAL | Status: DC
  Administered 2021-07-10 – 2021-07-12 (×5): 100 mg via ORAL
  Filled 2021-07-10 (×5): qty 1

## 2021-07-10 MED ORDER — OXYCODONE-ACETAMINOPHEN 5-325 MG PO TABS
1.0000 | ORAL_TABLET | ORAL | Status: DC | PRN
Start: 2021-07-10 — End: 2021-07-12
  Administered 2021-07-10 – 2021-07-12 (×12): 1 via ORAL
  Filled 2021-07-10 (×12): qty 1

## 2021-07-10 NOTE — Progress Notes (Signed)
PROGRESS NOTE    Francisco Roman  KAJ:[id number] DOB: 01/20/1966 DOA: 07/09/2021 PCP: [place] Pink, MD   Chief complaint.  Scrotal pain and swelling. Brief Narrative:  Francisco Roman is a 55 y.o. male with medical history significant for gout currently on allopurinol, history of diverticulitis, presence of diverticulosis, who presents emergency department from home for chief concerns of scrotal swelling.  Doppler ultrasound showed increased flow to the testicle.  Consistent with epididymoorchitis. Patient is treated with Rocephin, I also ordered doxycycline   Assessment & Plan:   Principal Problem:   Epididymitis Active Problems:   Gout   Diverticulitis   Leukocytosis  Severe sepsis secondary to epididymoorchitis. Epididymoorchitis. Urinary tract infection. Reviewed patient chart, patient had a fever, tachycardia and leukocytosis.  Lactic acid level significant elevated at 3.4.  Patient meets severe sepsis criteria.  This is secondary to infection. Duplex ultrasound showed increased flow in the testicle, no evidence of torsion. Continue Rocephin, also added doxycycline. Still has a fever today, will follow closely. Patient also has abnormal urine, suggest UTI, urine cultures pending.       DVT prophylaxis: Lovenox Code Status: full Family Communication:  Disposition Plan:    Status is: Observation  Will talk with UR for possible change to inpatient status.       I/O last 3 completed shifts: In: 1250 [I.V.:1000; IV Piggyback:250] Out: -  Total I/O In: 360 [P.O.:360] Out: -      Consultants:  None  Procedures: None  Antimicrobials:Rocephin  and doxycycline.  Subjective: Patient spiked a fever this morning with a temperature 108, he has some chills. Is still complaining of pain in the scrotum, but swelling is better than yesterday. He does not seem to have any dysuria hematuria No short of breath or cough. No abdominal pain or nausea vomiting  and no diarrhea constipation.  Objective: Vitals:   07/09/21 1958 07/10/21 0011 07/10/21 0413 07/10/21 0737  BP: 135/77 118/64 113/68 118/67  Pulse: 77 88 68 79  Resp: 16 20 18 18   Temp: 99 F (37.2 C) (!) 100.8 F (38.2 C) 98.2 F (36.8 C) 100 F (37.8 C)  TempSrc: Oral Oral Oral Oral  SpO2: 100% 96% 98% 99%  Weight:      Height:        Intake/Output Summary (Last 24 hours) at 07/10/2021 1112 Last data filed at 07/10/2021 1023 Gross per 24 hour  Intake 1610 ml  Output --  Net 1610 ml   Filed Weights   07/09/21 1113  Weight: 120.2 kg    Examination:  General exam: Appears calm and comfortable  Respiratory system: Clear to auscultation. Respiratory effort normal. Cardiovascular system: S1 & S2 heard, RRR. No JVD, murmurs, rubs, gallops or clicks. No pedal edema. Gastrointestinal system: Abdomen is nondistended, soft and nontender. No organomegaly or masses felt. Normal bowel sounds heard. Central nervous system: Alert and oriented. No focal neurological deficits. Extremities: Symmetric 5 x 5 power. Skin: No rashes, lesions or ulcers Psychiatry: Judgement and insight appear normal. Mood & affect appropriate.  Scrotum edema with enlarged right testicle.    Data Reviewed: I have personally reviewed following labs and imaging studies  CBC: Recent Labs  Lab 07/09/21 1128 07/10/21 0523  WBC 12.0* 11.4*  NEUTROABS 10.0*  --   HGB 14.8 14.3  HCT 41.7 41.8  MCV 96.5 98.6  PLT 238 035   Basic Metabolic Panel: Recent Labs  Lab 07/09/21 1128 07/10/21 0523  NA 135 136  K 3.8 4.1  CL 99 105  CO2 22 26  GLUCOSE 121* 107*  BUN 16 16  CREATININE 1.15 0.95  CALCIUM 8.9 8.6*   GFR: Estimated Creatinine Clearance: 118.7 mL/min (by C-G formula based on SCr of 0.95 mg/dL). Liver Function Tests: Recent Labs  Lab 07/09/21 1128  AST 29  ALT 27  ALKPHOS 72  BILITOT 1.2  PROT 8.0  ALBUMIN 3.7   No results for input(s): LIPASE, AMYLASE in the last 168  hours. No results for input(s): AMMONIA in the last 168 hours. Coagulation Profile: Recent Labs  Lab 07/09/21 1128  INR 1.1   Cardiac Enzymes: No results for input(s): CKTOTAL, CKMB, CKMBINDEX, TROPONINI in the last 168 hours. BNP (last 3 results) No results for input(s): PROBNP in the last 8760 hours. HbA1C: No results for input(s): HGBA1C in the last 72 hours. CBG: No results for input(s): GLUCAP in the last 168 hours. Lipid Profile: No results for input(s): CHOL, HDL, LDLCALC, TRIG, CHOLHDL, LDLDIRECT in the last 72 hours. Thyroid Function Tests: No results for input(s): TSH, T4TOTAL, FREET4, T3FREE, THYROIDAB in the last 72 hours. Anemia Panel: No results for input(s): VITAMINB12, FOLATE, FERRITIN, TIBC, IRON, RETICCTPCT in the last 72 hours. Sepsis Labs: Recent Labs  Lab 07/09/21 1128 07/09/21 1351  LATICACIDVEN 3.4* 1.3    Recent Results (from the past 240 hour(s))  Resp Panel by RT-PCR (Flu A&B, Covid) Nasopharyngeal Swab     Status: None   Collection Time: 07/09/21 11:28 AM   Specimen: Nasopharyngeal Swab; Nasopharyngeal(NP) swabs in vial transport medium  Result Value Ref Range Status   SARS Coronavirus 2 by RT PCR NEGATIVE NEGATIVE Final    Comment: (NOTE) SARS-CoV-2 target nucleic acids are NOT DETECTED.  The SARS-CoV-2 RNA is generally detectable in upper respiratory specimens during the acute phase of infection. The lowest concentration of SARS-CoV-2 viral copies this assay can detect is 138 copies/mL. A negative result does not preclude SARS-Cov-2 infection and should not be used as the sole basis for treatment or other patient management decisions. A negative result may occur with  improper specimen collection/handling, submission of specimen other than nasopharyngeal swab, presence of viral mutation(s) within the areas targeted by this assay, and inadequate number of viral copies(<138 copies/mL). A negative result must be combined with clinical  observations, patient history, and epidemiological information. The expected result is Negative.  Fact Sheet for Patients:  EntrepreneurPulse.com.au  Fact Sheet for Healthcare Providers:  IncredibleEmployment.be  This test is no t yet approved or cleared by the [place] FDA and  has been authorized for detection and/or diagnosis of SARS-CoV-2 by FDA under an Emergency Use Authorization (EUA). This EUA will remain  in effect (meaning this test can be used) for the duration of the COVID-19 declaration under Section 564(b)(1) of the Act, 21 U.S.C.section 360bbb-3(b)(1), unless the authorization is terminated  or revoked sooner.       Influenza A by PCR NEGATIVE NEGATIVE Final   Influenza B by PCR NEGATIVE NEGATIVE Final    Comment: (NOTE) The Xpert Xpress SARS-CoV-2/FLU/RSV plus assay is intended as an aid in the diagnosis of influenza from Nasopharyngeal swab specimens and should not be used as a sole basis for treatment. Nasal washings and aspirates are unacceptable for Xpert Xpress SARS-CoV-2/FLU/RSV testing.  Fact Sheet for Patients: EntrepreneurPulse.com.au  Fact Sheet for Healthcare Providers: IncredibleEmployment.be  This test is not yet approved or cleared by the [place] FDA and has been authorized for detection and/or diagnosis of SARS-CoV-2 by FDA under an  Emergency Use Authorization (EUA). This EUA will remain in effect (meaning this test can be used) for the duration of the COVID-19 declaration under Section 564(b)(1) of the Act, 21 U.S.C. section 360bbb-3(b)(1), unless the authorization is terminated or revoked.  Performed at [facility], [address] [place], [place] [address]   [place] rt PCR [facility] only)     Status: None   Collection Time: 07/09/21  3:13 PM   Specimen: Urine  Result Value Ref Range Status   Specimen source GC/Chlam URINE, RANDOM  Final    Chlamydia Tr NOT DETECTED NOT DETECTED Final   N gonorrhoeae NOT DETECTED NOT DETECTED Final    Comment: (NOTE) This CT/NG assay has not been evaluated in patients with a history of  hysterectomy. Performed at [facility], [address], [place], [place] [address]          Radiology Studies: CT ABDOMEN PELVIS W CONTRAST  Result Date: 07/09/2021 CLINICAL DATA:  Sudden onset of chills and shaking with right scrotal enlargement. Clinical concern for complicated hernia. History of diverticulitis. EXAM: CT ABDOMEN AND PELVIS WITH CONTRAST TECHNIQUE: Multidetector CT imaging of the abdomen and pelvis was performed using the standard protocol following bolus administration of intravenous contrast. CONTRAST:  160mL OMNIPAQUE IOHEXOL 300 MG/ML  SOLN COMPARISON:  Abdominopelvic CT 09/22/2011. FINDINGS: Lower chest: Tiny nodules at the right lung base are unchanged from the remote CT, consistent with benign findings. The lung bases are otherwise clear. There is no pleural effusion or pneumothorax. Hepatobiliary: The previously demonstrated hepatic steatosis appears improved, and no focal abnormality or abnormal enhancement identified. No evidence of gallstones, gallbladder wall thickening or biliary dilatation. Pancreas: Unremarkable. No pancreatic ductal dilatation or surrounding inflammatory changes. Spleen: Normal in size without focal abnormality. Adrenals/Urinary Tract: Both adrenal glands appear normal. There is a small cyst posteriorly in the lower pole of the left kidney. Both kidneys otherwise appear unremarkable. No evidence of urinary tract calculus or hydronephrosis. The bladder appears unremarkable for its degree of distention. Stomach/Bowel: No enteric contrast administered. The stomach appears unremarkable for its degree of distension. No evidence of bowel wall thickening, distention or surrounding inflammatory change. The appendix appears normal. There are mild diverticular changes  of the descending and sigmoid colon. There is no herniated bowel. Vascular/Lymphatic: There are no enlarged abdominal or pelvic lymph nodes. No significant vascular findings. Reproductive: The prostate gland and seminal vesicles appear unremarkable. There are small bilateral scrotal hydroceles,, larger on the right. There is a possible extratesticular 1.4 cm low-density lesion inferiorly in the right scrotum, best seen on coronal image 37/5. Other: There is diastasis of the rectus abdominus muscles which appears unchanged. No inguinal or other abdominal wall hernia identified. There is no ascites or free air. Musculoskeletal: No acute or significant osseous findings. Lower lumbar spondylosis noted. IMPRESSION: 1. Bilateral hydroceles with small low-density extratesticular mass in the right scrotum. Recommend further evaluation with scrotal ultrasound. 2. No evidence of abdominal wall hernia, bowel obstruction or acute inflammation. 3. Improvement in previously demonstrated hepatic steatosis. 4. Distal colonic diverticulosis without acute inflammation. 5. Lumbar spondylosis. Electronically Signed   By: Richardean Sale M.D.   On: 07/09/2021 13:48   DG Chest Port 1 View  Result Date: 07/09/2021 CLINICAL DATA:  Questionable sepsis - evaluate for abnormality EXAM: PORTABLE CHEST 1 VIEW COMPARISON:  None. FINDINGS: The heart size and mediastinal contours are within normal limits. Both lungs are clear. No pleural effusion. The visualized skeletal structures are unremarkable. IMPRESSION: No acute  process in the chest. Electronically Signed   By: Macy Mis M.D.   On: 07/09/2021 12:12   US SCROTUM W/DOPPLER  Result Date: 07/09/2021 CLINICAL DATA:  Scrotal pain for 3 days EXAM: SCROTAL ULTRASOUND DOPPLER ULTRASOUND OF THE TESTICLES TECHNIQUE: Complete ultrasound examination of the testicles, epididymis, and other scrotal structures was performed. Color and spectral Doppler ultrasound were also utilized to  evaluate blood flow to the testicles. COMPARISON:  None. FINDINGS: Right testicle Measurements: 4.8 x 3.4 x 4.0 cm. No mass or microlithiasis visualized. Asymmetrically vascular. Left testicle Measurements: 5.2 x 2.7 x 3.3 cm. No mass or microlithiasis visualized. Right epididymis:  Enlarged, hypervascular, and heterogeneous. Left epididymis:  Normal in size and appearance. Hydrocele:  Small right hydrocele. Varicocele:  None visualized. Pulsed Doppler interrogation of both testes demonstrates normal low resistance arterial and venous waveforms bilaterally. IMPRESSION: Increased vascularity of the right testicle and epididymis with asymmetric enlargement and heterogeneity of the right epididymal tail most consistent with epididymal orchitis. Follow-up ultrasound should be performed in 6-8 weeks to exclude epididymal mass. Electronically Signed   By: Miachel Roux M.D.   On: 07/09/2021 15:06        Scheduled Meds:  allopurinol  300 mg Oral Daily   doxycycline  100 mg Oral Q12H   enoxaparin (LOVENOX) injection  0.5 mg/kg Subcutaneous QHS   fluticasone  1 spray Each Nare Daily   multivitamin with minerals  1 tablet Oral Daily   Continuous Infusions:  cefTRIAXone (ROCEPHIN)  IV     promethazine (PHENERGAN) injection (IM or IVPB) Stopped (07/09/21 1733)     LOS: 0 days    Time spent: 28 minutes    Sharen Hones, MD [facility]   To contact the attending provider between 7A-7P or the covering provider during after hours 7P-7A, please log into the web site [email or web address] and access using universal [place] password for that web site. If you do not have the password, please call the hospital operator.  07/10/2021, 11:12 AM

## 2021-07-11 ENCOUNTER — Encounter: Payer: Self-pay | Admitting: Internal Medicine

## 2021-07-11 ENCOUNTER — Inpatient Hospital Stay: Payer: BC Managed Care – PPO

## 2021-07-11 DIAGNOSIS — N453 Epididymo-orchitis: Secondary | ICD-10-CM

## 2021-07-11 LAB — URINE CULTURE: Culture: NO GROWTH

## 2021-07-11 MED ORDER — PIPERACILLIN-TAZOBACTAM 4.5 G IVPB
4.5000 g | Freq: Four times a day (QID) | INTRAVENOUS | Status: DC
  Administered 2021-07-11 – 2021-07-12 (×6): 4.5 g via INTRAVENOUS
  Filled 2021-07-11 (×9): qty 100

## 2021-07-11 NOTE — Progress Notes (Signed)
PROGRESS NOTE    Francisco Roman  DJS:[id number] DOB: Dec 10, 1965 DOA: 07/09/2021 PCP: [place] Pink, MD   Chief complaint.  Scrotal pain. Brief Narrative:  Francisco Roman is a 55 y.o. male with medical history significant for gout currently on allopurinol, history of diverticulitis, presence of diverticulosis, who presents emergency department from home for chief concerns of scrotal swelling.  Doppler ultrasound showed increased flow to the testicle.  Consistent with epididymoorchitis. Patient is treated with Rocephin, I also ordered doxycycline   Assessment & Plan:   Principal Problem:   Epididymitis Active Problems:   Gout   Leukocytosis   Acute lower UTI   Severe sepsis (HCC)   Epididymoorchitis  Severe sepsis secondary to epididymoorchitis. Epididymoorchitis. Urinary tract infection. Blood culture had no growth, urine culture seems to be pending. Patient had a worsening pain in the testicle, will consult urology.  Antibiotic switched to Zosyn and doxycycline. Based on my examination, the right testicle still swollen significantly, but does not seem to be worse.   DVT prophylaxis: Lovenox Code Status: full Family Communication:  Disposition Plan:    Status is: Inpatient  Remains inpatient appropriate because: Due to severity of disease, still require IV antibiotics.  Pain not under control        I/O last 3 completed shifts: In: 1260 [P.O.:1260] Out: -  No intake/output data recorded.     Consultants:  Urology  Procedures: None  Antimicrobials: Zosyn and doxycycline.  Subjective: Patient states that he was not able to get his pain medicine on time last night, he woke up this morning has a severe pain in the right testicle.  Still has significant swelling. Normal fever today. No nausea vomiting abdominal pain. No short of breath or cough.  Objective: Vitals:   07/10/21 1926 07/10/21 2355 07/11/21 0558 07/11/21 0727  BP: 126/83 107/79 124/76  122/76  Pulse: 84 69 74 67  Resp: 18 18 18 16   Temp: 98.7 F (37.1 C) 98.6 F (37 C) 98.7 F (37.1 C) 98 F (36.7 C)  TempSrc: Oral  Oral Oral  SpO2: 96% 97% 94% 97%  Weight:      Height:        Intake/Output Summary (Last 24 hours) at 07/11/2021 0936 Last data filed at 07/10/2021 1846 Gross per 24 hour  Intake 1260 ml  Output --  Net 1260 ml   Filed Weights   07/09/21 1113  Weight: 120.2 kg    Examination:  General exam: Appears calm and comfortable  Respiratory system: Clear to auscultation. Respiratory effort normal. Cardiovascular system: S1 & S2 heard, RRR. No JVD, murmurs, rubs, gallops or clicks. No pedal edema. Gastrointestinal system: Abdomen is nondistended, soft and nontender. No organomegaly or masses felt. Normal bowel sounds heard. Central nervous system: Alert and oriented. No focal neurological deficits. Extremities: Symmetric 5 x 5 power. Skin: No rashes, lesions or ulcers Psychiatry: Judgement and insight appear normal. Mood & affect appropriate.  Enlarged right testicle, scrotal edema much improved.    Data Reviewed: I have personally reviewed following labs and imaging studies  CBC: Recent Labs  Lab 07/09/21 1128 07/10/21 0523  WBC 12.0* 11.4*  NEUTROABS 10.0*  --   HGB 14.8 14.3  HCT 41.7 41.8  MCV 96.5 98.6  PLT 238 885   Basic Metabolic Panel: Recent Labs  Lab 07/09/21 1128 07/10/21 0523  NA 135 136  K 3.8 4.1  CL 99 105  CO2 22 26  GLUCOSE 121* 107*  BUN 16 16  CREATININE  1.15 0.95  CALCIUM 8.9 8.6*   GFR: Estimated Creatinine Clearance: 118.7 mL/min (by C-G formula based on SCr of 0.95 mg/dL). Liver Function Tests: Recent Labs  Lab 07/09/21 1128  AST 29  ALT 27  ALKPHOS 72  BILITOT 1.2  PROT 8.0  ALBUMIN 3.7   No results for input(s): LIPASE, AMYLASE in the last 168 hours. No results for input(s): AMMONIA in the last 168 hours. Coagulation Profile: Recent Labs  Lab 07/09/21 1128  INR 1.1   Cardiac  Enzymes: No results for input(s): CKTOTAL, CKMB, CKMBINDEX, TROPONINI in the last 168 hours. BNP (last 3 results) No results for input(s): PROBNP in the last 8760 hours. HbA1C: No results for input(s): HGBA1C in the last 72 hours. CBG: No results for input(s): GLUCAP in the last 168 hours. Lipid Profile: No results for input(s): CHOL, HDL, LDLCALC, TRIG, CHOLHDL, LDLDIRECT in the last 72 hours. Thyroid Function Tests: No results for input(s): TSH, T4TOTAL, FREET4, T3FREE, THYROIDAB in the last 72 hours. Anemia Panel: No results for input(s): VITAMINB12, FOLATE, FERRITIN, TIBC, IRON, RETICCTPCT in the last 72 hours. Sepsis Labs: Recent Labs  Lab 07/09/21 1128 07/09/21 1351  LATICACIDVEN 3.4* 1.3    Recent Results (from the past 240 hour(s))  Resp Panel by RT-PCR (Flu A&B, Covid) Nasopharyngeal Swab     Status: None   Collection Time: 07/09/21 11:28 AM   Specimen: Nasopharyngeal Swab; Nasopharyngeal(NP) swabs in vial transport medium  Result Value Ref Range Status   SARS Coronavirus 2 by RT PCR NEGATIVE NEGATIVE Final    Comment: (NOTE) SARS-CoV-2 target nucleic acids are NOT DETECTED.  The SARS-CoV-2 RNA is generally detectable in upper respiratory specimens during the acute phase of infection. The lowest concentration of SARS-CoV-2 viral copies this assay can detect is 138 copies/mL. A negative result does not preclude SARS-Cov-2 infection and should not be used as the sole basis for treatment or other patient management decisions. A negative result may occur with  improper specimen collection/handling, submission of specimen other than nasopharyngeal swab, presence of viral mutation(s) within the areas targeted by this assay, and inadequate number of viral copies(<138 copies/mL). A negative result must be combined with clinical observations, patient history, and epidemiological information. The expected result is Negative.  Fact Sheet for Patients:   EntrepreneurPulse.com.au  Fact Sheet for Healthcare Providers:  IncredibleEmployment.be  This test is no t yet approved or cleared by the [place] FDA and  has been authorized for detection and/or diagnosis of SARS-CoV-2 by FDA under an Emergency Use Authorization (EUA). This EUA will remain  in effect (meaning this test can be used) for the duration of the COVID-19 declaration under Section 564(b)(1) of the Act, 21 U.S.C.section 360bbb-3(b)(1), unless the authorization is terminated  or revoked sooner.       Influenza A by PCR NEGATIVE NEGATIVE Final   Influenza B by PCR NEGATIVE NEGATIVE Final    Comment: (NOTE) The Xpert Xpress SARS-CoV-2/FLU/RSV plus assay is intended as an aid in the diagnosis of influenza from Nasopharyngeal swab specimens and should not be used as a sole basis for treatment. Nasal washings and aspirates are unacceptable for Xpert Xpress SARS-CoV-2/FLU/RSV testing.  Fact Sheet for Patients: EntrepreneurPulse.com.au  Fact Sheet for Healthcare Providers: IncredibleEmployment.be  This test is not yet approved or cleared by the [place] FDA and has been authorized for detection and/or diagnosis of SARS-CoV-2 by FDA under an Emergency Use Authorization (EUA). This EUA will remain in effect (meaning this test can be used) for  the duration of the COVID-19 declaration under Section 564(b)(1) of the Act, 21 U.S.C. section 360bbb-3(b)(1), unless the authorization is terminated or revoked.  Performed at [facility], [address] [place], [place] [address]   Blood culture (routine single)     Status: None (Preliminary result)   Collection Time: 07/09/21 11:29 AM   Specimen: BLOOD  Result Value Ref Range Status   Specimen Description BLOOD RIGHT ANTECUBITAL  Final   Special Requests   Final    BOTTLES DRAWN AEROBIC AND ANAEROBIC Blood Culture results may not be  optimal due to an inadequate volume of blood received in culture bottles   Culture   Final    NO GROWTH 2 DAYS Performed at [facility], [address] [place], [place] [address]    Report Status PENDING  Incomplete  Chlamydia/NGC rt PCR ([place] only)     Status: None   Collection Time: 07/09/21  3:13 PM   Specimen: Urine  Result Value Ref Range Status   Specimen source GC/Chlam URINE, RANDOM  Final   Chlamydia Tr NOT DETECTED NOT DETECTED Final   N gonorrhoeae NOT DETECTED NOT DETECTED Final    Comment: (NOTE) This CT/NG assay has not been evaluated in patients with a history of  hysterectomy. Performed at [facility], [address], [place], [place] [address]          Radiology Studies: CT ABDOMEN PELVIS W CONTRAST  Result Date: 07/09/2021 CLINICAL DATA:  Sudden onset of chills and shaking with right scrotal enlargement. Clinical concern for complicated hernia. History of diverticulitis. EXAM: CT ABDOMEN AND PELVIS WITH CONTRAST TECHNIQUE: Multidetector CT imaging of the abdomen and pelvis was performed using the standard protocol following bolus administration of intravenous contrast. CONTRAST:  166mL OMNIPAQUE IOHEXOL 300 MG/ML  SOLN COMPARISON:  Abdominopelvic CT 09/22/2011. FINDINGS: Lower chest: Tiny nodules at the right lung base are unchanged from the remote CT, consistent with benign findings. The lung bases are otherwise clear. There is no pleural effusion or pneumothorax. Hepatobiliary: The previously demonstrated hepatic steatosis appears improved, and no focal abnormality or abnormal enhancement identified. No evidence of gallstones, gallbladder wall thickening or biliary dilatation. Pancreas: Unremarkable. No pancreatic ductal dilatation or surrounding inflammatory changes. Spleen: Normal in size without focal abnormality. Adrenals/Urinary Tract: Both adrenal glands appear normal. There is a small cyst posteriorly in the lower pole of the left kidney.  Both kidneys otherwise appear unremarkable. No evidence of urinary tract calculus or hydronephrosis. The bladder appears unremarkable for its degree of distention. Stomach/Bowel: No enteric contrast administered. The stomach appears unremarkable for its degree of distension. No evidence of bowel wall thickening, distention or surrounding inflammatory change. The appendix appears normal. There are mild diverticular changes of the descending and sigmoid colon. There is no herniated bowel. Vascular/Lymphatic: There are no enlarged abdominal or pelvic lymph nodes. No significant vascular findings. Reproductive: The prostate gland and seminal vesicles appear unremarkable. There are small bilateral scrotal hydroceles,, larger on the right. There is a possible extratesticular 1.4 cm low-density lesion inferiorly in the right scrotum, best seen on coronal image 37/5. Other: There is diastasis of the rectus abdominus muscles which appears unchanged. No inguinal or other abdominal wall hernia identified. There is no ascites or free air. Musculoskeletal: No acute or significant osseous findings. Lower lumbar spondylosis noted. IMPRESSION: 1. Bilateral hydroceles with small low-density extratesticular mass in the right scrotum. Recommend further evaluation with scrotal ultrasound. 2. No evidence of abdominal wall hernia, bowel obstruction or  acute inflammation. 3. Improvement in previously demonstrated hepatic steatosis. 4. Distal colonic diverticulosis without acute inflammation. 5. Lumbar spondylosis. Electronically Signed   By: Richardean Sale M.D.   On: 07/09/2021 13:48   DG Chest Port 1 View  Result Date: 07/09/2021 CLINICAL DATA:  Questionable sepsis - evaluate for abnormality EXAM: PORTABLE CHEST 1 VIEW COMPARISON:  None. FINDINGS: The heart size and mediastinal contours are within normal limits. Both lungs are clear. No pleural effusion. The visualized skeletal structures are unremarkable. IMPRESSION: No acute  process in the chest. Electronically Signed   By: Macy Mis M.D.   On: 07/09/2021 12:12   US SCROTUM W/DOPPLER  Result Date: 07/09/2021 CLINICAL DATA:  Scrotal pain for 3 days EXAM: SCROTAL ULTRASOUND DOPPLER ULTRASOUND OF THE TESTICLES TECHNIQUE: Complete ultrasound examination of the testicles, epididymis, and other scrotal structures was performed. Color and spectral Doppler ultrasound were also utilized to evaluate blood flow to the testicles. COMPARISON:  None. FINDINGS: Right testicle Measurements: 4.8 x 3.4 x 4.0 cm. No mass or microlithiasis visualized. Asymmetrically vascular. Left testicle Measurements: 5.2 x 2.7 x 3.3 cm. No mass or microlithiasis visualized. Right epididymis:  Enlarged, hypervascular, and heterogeneous. Left epididymis:  Normal in size and appearance. Hydrocele:  Small right hydrocele. Varicocele:  None visualized. Pulsed Doppler interrogation of both testes demonstrates normal low resistance arterial and venous waveforms bilaterally. IMPRESSION: Increased vascularity of the right testicle and epididymis with asymmetric enlargement and heterogeneity of the right epididymal tail most consistent with epididymal orchitis. Follow-up ultrasound should be performed in 6-8 weeks to exclude epididymal mass. Electronically Signed   By: Miachel Roux M.D.   On: 07/09/2021 15:06        Scheduled Meds:  allopurinol  300 mg Oral Daily   doxycycline  100 mg Oral Q12H   enoxaparin (LOVENOX) injection  0.5 mg/kg Subcutaneous QHS   fluticasone  1 spray Each Nare Daily   multivitamin with minerals  1 tablet Oral Daily   Continuous Infusions:  piperacillin-tazobactam (ZOSYN)  IV     promethazine (PHENERGAN) injection (IM or IVPB) Stopped (07/09/21 1733)     LOS: 1 day    Time spent: 28 minutes    Sharen Hones, MD [facility]   To contact the attending provider between 7A-7P or the covering provider during after hours 7P-7A, please log into the web site  [email or web address] and access using universal [place] password for that web site. If you do not have the password, please call the hospital operator.  07/11/2021, 9:36 AM

## 2021-07-11 NOTE — Consult Note (Addendum)
Urology Consult   Physician requesting consult: Sharen Hones, MD  Reason for consult: Right epididymo-orchitis   History of Present Illness: Francisco Roman is a 55 y.o. with past medical history of gout and diverticulosis with admitted for severe right epididymo-orchitis.  Patient states that this past Wednesday, he was walking to his car from a job and noted some right testicular pain.  This worsened severely over the next few hours and was evaluated in a clinic on Thursday morning.  At this time, he stated that his right testicle was about the size of a tennis ball.  He denied any fevers and noted his temperature was around 99.  He had increased discomfort and pain in his right testicle that persisted throughout Friday.  He then presented to the ED on Friday (07/09/2021) with severe right testicular pain and nausea.  He denies any dysuria or gross hematuria.  He denies a history of sexually-transmitted infections.  He states that he is not at risk for sexually transmitted infection.  He is in a monogamous relationship with his spouse and had sexual activity on 07/03/2021.  He tested negative for gonorrhea, chlamydia and HIV on 07/09/2021.  CT A/P 07/09/2021 demonstrated bilateral hydroceles with small low-density extratesticular mass in the right scrotum.  Subsequent scrotal ultrasound 07/09/2021 demonstrated increased vascularity in the right testicle and epididymis consistent with epididymo-orchitis.  He was admitted and placed on ceftriaxone.  He stated that his pain improved on 07/10/2021.  When he awoke on 07/11/2021, he states that he missed some pain medication had worsening right-sided pain.  He remains afebrile.  He states that his right testicle is similar in size from a couple days ago.  Follow-up scrotal ultrasound 07/11/2021 demonstrated persistent right epididymo-orchitis with reactive hydrocele and mild scrotal thickening.  There was a small hypoechoic area in epididymis likely reflecting  inflammation measuring 7 mm.  There was some improvement in the hypervascularity noted to his right testicle.  There is no significant abscess.  Patient denies prior episodes of epididymo-orchitis.  He denies a history of voiding or storage urinary symptoms, hematuria, UTIs, STDs, urolithiasis, GU malignancy/trauma/surgery.  Past Medical History:  Diagnosis Date   Diverticulitis 2010   with small perforation   GERD (gastroesophageal reflux disease)    Gout     Past Surgical History:  Procedure Laterality Date   COLONOSCOPY WITH PROPOFOL N/A 11/23/2018   Procedure: COLONOSCOPY WITH PROPOFOL;  Surgeon: Lollie Sails, MD;  Location: [facility] ENDOSCOPY;  Service: Endoscopy;  Laterality: N/A;   HERNIA REPAIR Left 1986   HERNIA REPAIR  December 2992   Umbilical, 6.4 cm Ventralex ST mesh   UMBILICAL HERNIA REPAIR N/A 09/09/2015   Procedure: HERNIA REPAIR UMBILICAL ADULT;  Surgeon: Robert Bellow, MD;  Location: [facility] ORS;  Service: General;  Laterality: N/A;    Medications:  Home meds:  No current facility-administered medications on file prior to encounter.   Current Outpatient Medications on File Prior to Encounter  Medication Sig Dispense Refill   allopurinol (ZYLOPRIM) 300 MG tablet Take 300 mg by mouth daily.     aspirin 81 MG EC tablet Take 1 tablet by mouth daily.     ciprofloxacin (CIPRO) 500 MG tablet Take 500 mg by mouth 2 (two) times daily.     cyanocobalamin 1000 MCG tablet Take 1 tablet by mouth daily.     fluticasone (FLONASE) 50 MCG/ACT nasal spray Place 1 spray into both nostrils daily.     ibuprofen (ADVIL,MOTRIN) 200 MG tablet Take  200 mg by mouth every 6 (six) hours as needed.     Multiple Vitamin (MULTIVITAMIN) capsule Take 1 capsule by mouth daily.     Omega-3 1000 MG CAPS Take 2 capsules by mouth daily.     polyethylene glycol (MIRALAX / GLYCOLAX) packet Take 17 g by mouth daily.      tetrahydrozoline 0.05 % ophthalmic solution Place 1 drop into both eyes  daily as needed.       Scheduled Meds:  allopurinol  300 mg Oral Daily   doxycycline  100 mg Oral Q12H   enoxaparin (LOVENOX) injection  0.5 mg/kg Subcutaneous QHS   fluticasone  1 spray Each Nare Daily   multivitamin with minerals  1 tablet Oral Daily   Continuous Infusions:  piperacillin-tazobactam (ZOSYN)  IV 4.5 g (07/11/21 1230)   promethazine (PHENERGAN) injection (IM or IVPB) Stopped (07/09/21 1733)   PRN Meds:.acetaminophen **OR** acetaminophen, melatonin, ondansetron **OR** ondansetron (ZOFRAN) IV, oxyCODONE-acetaminophen, oxymetazoline, promethazine (PHENERGAN) injection (IM or IVPB)  Allergies:  Allergies  Allergen Reactions   Codeine Other (See Comments), Rash and Nausea And Vomiting    History reviewed. No pertinent family history.  Social History:  reports that he has never smoked. He has never used smokeless tobacco. He reports current alcohol use of about 2.0 standard drinks per week. He reports that he does not use drugs.  ROS: A complete review of systems was performed.  All systems are negative except for pertinent findings as noted.  Physical Exam:  Vital signs in last 24 hours: Temp:  [98 F (36.7 C)-99.6 F (37.6 C)] 98 F (36.7 C) (10/23 0727) Pulse Rate:  [67-88] 67 (10/23 0727) Resp:  [16-18] 16 (10/23 0727) BP: (107-131)/(76-83) 122/76 (10/23 0727) SpO2:  [94 %-99 %] 97 % (10/23 0727) Constitutional:  Alert and oriented, No acute distress Cardiovascular: Regular rate and rhythm Respiratory: Normal respiratory effort, Lungs clear bilaterally GI: Abdomen is soft, nontender, nondistended, no abdominal masses Genitourinary: No CVAT. Normal male phallus. Left hemiscrotum demonstrates no erythema.  Underlying left testicle and epididymis are normal in size and have no tenderness. Right hemiscrotum is enlarged with mild subtle underlying erythema.  His right testicle and epididymis are tender to palpation.  There is no overlying necrosis, fluctuance,  induration.  His perineum is without tenderness and there is normal-appearing. Neurologic: Grossly intact, no focal deficits Psychiatric: Normal mood and affect  Laboratory Data:  Recent Labs    07/09/21 1128 07/10/21 0523  WBC 12.0* 11.4*  HGB 14.8 14.3  HCT 41.7 41.8  PLT 238 242    Recent Labs    07/09/21 1128 07/10/21 0523  NA 135 136  K 3.8 4.1  CL 99 105  GLUCOSE 121* 107*  BUN 16 16  CALCIUM 8.9 8.6*  CREATININE 1.15 0.95     No results found for this or any previous visit (from the past 24 hour(s)). Recent Results (from the past 240 hour(s))  Urine Culture     Status: None   Collection Time: 07/09/21 11:28 AM   Specimen: In/Out Cath Urine  Result Value Ref Range Status   Specimen Description   Final    IN/OUT CATH URINE Performed at [facility], [address] [place], [place] [address]    Special Requests   Final    NONE Performed at [facility], [address], [place], [place] [address]    Culture   Final    NO GROWTH Performed at [facility], [place]  [address], [place], [place] [address]    Report Status 07/11/2021 FINAL  Final  Resp Panel by RT-PCR (Flu A&B, Covid) Nasopharyngeal Swab     Status: None   Collection Time: 07/09/21 11:28 AM   Specimen: Nasopharyngeal Swab; Nasopharyngeal(NP) swabs in vial transport medium  Result Value Ref Range Status   SARS Coronavirus 2 by RT PCR NEGATIVE NEGATIVE Final    Comment: (NOTE) SARS-CoV-2 target nucleic acids are NOT DETECTED.  The SARS-CoV-2 RNA is generally detectable in upper respiratory specimens during the acute phase of infection. The lowest concentration of SARS-CoV-2 viral copies this assay can detect is 138 copies/mL. A negative result does not preclude SARS-Cov-2 infection and should not be used as the sole basis for treatment or other patient management decisions. A negative result may occur with  improper specimen collection/handling, submission of specimen  other than nasopharyngeal swab, presence of viral mutation(s) within the areas targeted by this assay, and inadequate number of viral copies(<138 copies/mL). A negative result must be combined with clinical observations, patient history, and epidemiological information. The expected result is Negative.  Fact Sheet for Patients:  EntrepreneurPulse.com.au  Fact Sheet for Healthcare Providers:  IncredibleEmployment.be  This test is no t yet approved or cleared by the [place] FDA and  has been authorized for detection and/or diagnosis of SARS-CoV-2 by FDA under an Emergency Use Authorization (EUA). This EUA will remain  in effect (meaning this test can be used) for the duration of the COVID-19 declaration under Section 564(b)(1) of the Act, 21 U.S.C.section 360bbb-3(b)(1), unless the authorization is terminated  or revoked sooner.       Influenza A by PCR NEGATIVE NEGATIVE Final   Influenza B by PCR NEGATIVE NEGATIVE Final    Comment: (NOTE) The Xpert Xpress SARS-CoV-2/FLU/RSV plus assay is intended as an aid in the diagnosis of influenza from Nasopharyngeal swab specimens and should not be used as a sole basis for treatment. Nasal washings and aspirates are unacceptable for Xpert Xpress SARS-CoV-2/FLU/RSV testing.  Fact Sheet for Patients: EntrepreneurPulse.com.au  Fact Sheet for Healthcare Providers: IncredibleEmployment.be  This test is not yet approved or cleared by the [place] FDA and has been authorized for detection and/or diagnosis of SARS-CoV-2 by FDA under an Emergency Use Authorization (EUA). This EUA will remain in effect (meaning this test can be used) for the duration of the COVID-19 declaration under Section 564(b)(1) of the Act, 21 U.S.C. section 360bbb-3(b)(1), unless the authorization is terminated or revoked.  Performed at [facility], [address]  [place], [place] [address]   Blood culture (routine single)     Status: None (Preliminary result)   Collection Time: 07/09/21 11:29 AM   Specimen: BLOOD  Result Value Ref Range Status   Specimen Description BLOOD RIGHT ANTECUBITAL  Final   Special Requests   Final    BOTTLES DRAWN AEROBIC AND ANAEROBIC Blood Culture results may not be optimal due to an inadequate volume of blood received in culture bottles   Culture   Final    NO GROWTH 2 DAYS Performed at [facility], [address] [place], [place] [address]    Report Status PENDING  Incomplete  Chlamydia/NGC rt PCR ([place] only)     Status: None   Collection Time: 07/09/21  3:13 PM   Specimen: Urine  Result Value Ref Range Status   Specimen source GC/Chlam URINE, RANDOM  Final   Chlamydia Tr NOT DETECTED NOT DETECTED Final   N gonorrhoeae NOT DETECTED NOT DETECTED Final  Comment: (NOTE) This CT/NG assay has not been evaluated in patients with a history of  hysterectomy. Performed at [facility], [address] [place], [place] [address]     Renal Function: Recent Labs    07/09/21 1128 07/10/21 0523  CREATININE 1.15 0.95   Estimated Creatinine Clearance: 118.7 mL/min (by C-G formula based on SCr of 0.95 mg/dL).  Radiologic Imaging: US SCROTUM  Result Date: 07/11/2021 CLINICAL DATA:  A 55 year old male with epididymo-orchitis noted on previous sonogram. EXAM: ULTRASOUND OF SCROTUM TECHNIQUE: Complete ultrasound examination of the testicles, epididymis, and other scrotal structures was performed. COMPARISON:  Comparison made with the study of July 09, 2021. FINDINGS: Right testicle Measurements: 4.4 x 3.6 x 4.4 cm. No mass or microlithiasis visualized. Left testicle Measurements: 5.4 x 3 x 3.2 cm. No mass or microlithiasis visualized. Right epididymis: Heterogeneous appearance of the RIGHT epididymis with expanded area in the tail. Markedly hyperemic. Subjective decrease in the amount of hyperemia  associated with the RIGHT testis as compared to previous imaging. Hypoechoic area more focally in the epididymal tail does not show color Doppler flow to a substantial degree, nearly anechoic area seen on image 105 within the epididymal tail is contiguous with hypoechoic areas, this measures 7 mm. LEFT. These areas are surrounded by hyperemia. Left epididymis: Cyst in the LEFT epididymis measuring 1 x 0.5 x 1.0 cm epididymis otherwise unremarkable. Hydrocele:  Present on the RIGHT moderate size. Varicocele:  None visualized. Scrotal thickening. IMPRESSION: Signs of epididymo-orchitis with reactive hydrocele and mild scrotal thickening, hypoechoic area in the epididymis is favored to represent sequela of severe inflammation perhaps with small abscess. Follow-up is suggested to ensure resolution. Query mild improvement with respect to the hypervascularity noted in the RIGHT testis but with persistent hypervascularity on the RIGHT as compared to the LEFT, comparison imaging (image 137 of 142) Given severity of above findings urologic consultation may be helpful to guide further management. Electronically Signed   By: Zetta Bills M.D.   On: 07/11/2021 14:07   US SCROTUM W/DOPPLER  Result Date: 07/09/2021 CLINICAL DATA:  Scrotal pain for 3 days EXAM: SCROTAL ULTRASOUND DOPPLER ULTRASOUND OF THE TESTICLES TECHNIQUE: Complete ultrasound examination of the testicles, epididymis, and other scrotal structures was performed. Color and spectral Doppler ultrasound were also utilized to evaluate blood flow to the testicles. COMPARISON:  None. FINDINGS: Right testicle Measurements: 4.8 x 3.4 x 4.0 cm. No mass or microlithiasis visualized. Asymmetrically vascular. Left testicle Measurements: 5.2 x 2.7 x 3.3 cm. No mass or microlithiasis visualized. Right epididymis:  Enlarged, hypervascular, and heterogeneous. Left epididymis:  Normal in size and appearance. Hydrocele:  Small right hydrocele. Varicocele:  None visualized.  Pulsed Doppler interrogation of both testes demonstrates normal low resistance arterial and venous waveforms bilaterally. IMPRESSION: Increased vascularity of the right testicle and epididymis with asymmetric enlargement and heterogeneity of the right epididymal tail most consistent with epididymal orchitis. Follow-up ultrasound should be performed in 6-8 weeks to exclude epididymal mass. Electronically Signed   By: Miachel Roux M.D.   On: 07/09/2021 15:06    I independently reviewed the above imaging studies.  Impression/Recommendation Severe right epididymo-orchitis: Onset of pain 10/19, admitted on 07/09/2021 with scrotal ultrasound demonstrating severe right epididymo-orchitis with no evidence of abscess and mild bilateral hydroceles.  He has remained afebrile, mild leukocytosis around 11, urine culture 07/09/2021 no growth, negative for chlamydia, gonorrhea or HIV.  Follow-up scrotal ultrasound 07/11/2021 with persistent right epididymitis however improved hypervascularity to the right testicle with reactive  hydrocele and small hypoechoic area in the right epididymis measuring 7 mm.  -I recommended broadening antibiotics.  He was previously receiving ceftriaxone.  He is now receiving Zosyn every 6 hours and doxycycline 100 mg twice daily. -He has a reassuring follow-up scrotal ultrasound today demonstrating persistent right epididymitis however improved hypervascularity of the right testicle.  No significant abscess that require surgical intervention at this point.  He is also remained afebrile. -I discussed expected course of improvement with broaden antibiotics and discussed that he will need to have another 2-week therapy of p.o. ciprofloxacin outpatient once his exam improves. -We did discuss that if he develops an abscess, we could proceed with scrotal exploration and drainage however this would possibly require a right epididymectomy and/or orchiectomy. -I recommend scrotal support, ice packs  and pain control. -Discussed that he will require follow-up with urology with repeat scrotal ultrasound in about 6 weeks to rule out underlying epididymal mass. -Following  Matt R. Asanti Craigo MD 07/11/2021, 2:29 PM  [facility] with repeat scrotal ultrasound in Pager: [phone]   CC: Sharen Hones, MD

## 2021-07-12 DIAGNOSIS — N451 Epididymitis: Secondary | ICD-10-CM

## 2021-07-12 MED ORDER — OXYCODONE-ACETAMINOPHEN 5-325 MG PO TABS: 1.0000 | ORAL_TABLET | ORAL | 0 refills | Status: AC | PRN

## 2021-07-12 MED ORDER — DOXYCYCLINE HYCLATE 100 MG PO TABS: 100.0000 mg | ORAL_TABLET | Freq: Two times a day (BID) | ORAL | 0 refills | Status: AC

## 2021-07-12 MED ORDER — AMOXICILLIN-POT CLAVULANATE 875-125 MG PO TABS: 1.0000 | ORAL_TABLET | Freq: Two times a day (BID) | ORAL | 0 refills | Status: AC

## 2021-07-12 NOTE — Progress Notes (Signed)
   Subjective Pain continues to improve and right testicle, would like to be discharged today versus tomorrow Afebrile overnight  Physical Exam: BP 120/75 (BP Location: Left Arm)   Pulse 61   Temp 98 F (36.7 C) (Oral)   Resp 18   Ht 5\' 11"  (1.803 m)   Wt 120.2 kg   SpO2 98%   BMI 36.96 kg/m    Constitutional:  Alert and oriented, No acute distress. Respiratory: Normal respiratory effort, no increased work of breathing. GI: Abdomen is soft, non-tender, non-distended GU: Circumcised phallus with patent meatus, right scrotum erythematous and moderate edema consistent with epididymoorchitis, no fluctuance, moderately tender but palpable throughout, left hemiscrotum normal  Pertinent Imaging: Scrotal ultrasound reviewed  Assessment & Plan:   55 year old male with severe right epididymoorchitis confirmed on ultrasound, improving over the last 24 to 48 hours.  No definite abscess on ultrasound that would require surgical intervention.  Recommended snug fitting underwear, icing, NSAIDs, and continuing antibiotics x2-week duration total.  Okay for discharge from a urology perspective, and we will arrange follow-up in 1 month for exam and possible repeat scrotal ultrasound.  Return precautions discussed extensively.  A total of 25 minutes were spent on the floor with greater than 50% spent in counseling and coordination of care with the patient regarding right epididymal orchitis and treatment options.  Billey Co, MD

## 2021-07-12 NOTE — Discharge Summary (Signed)
Physician Discharge Summary  Patient ID: Francisco Roman MRN: [id number] DOB/AGE: 1966-06-17 55 y.o.  Admit date: 07/09/2021 Discharge date: 07/12/2021  Admission Diagnoses:  Discharge Diagnoses:  Principal Problem:   Epididymitis Active Problems:   Gout   Leukocytosis   Acute lower UTI   Severe sepsis ([place])   Epididymoorchitis   Discharged Condition: good  Hospital Course:  Francisco Roman is a 55 y.o. male with medical history significant for gout currently on allopurinol, history of diverticulitis, presence of diverticulosis, who presents emergency department from home for chief concerns of scrotal swelling.  Doppler ultrasound showed increased flow to the testicle.  Consistent with epididymoorchitis. Patient is treated with Rocephin, I also ordered doxycycline Patient was switched to Zosyn plus doxycycline in the morning of 10/24.  Condition had improved.  Severe sepsis secondary to epididymoorchitis. Epididymoorchitis. Urinary tract infection. Blood culture had no growth, urine culture no growth. Condition had improved since switch over to Zosyn.  Patient has been seen by urology, deemed to be safe to discharge this point. Patient's failed Cipro treatment before came to the hospital.  At this point I will continue antibiotics with doxycycline in addition to Augmentin.  Total antibiotics course 10 days. Patient will finish up the final dose of Zosyn at 6 PM today before discharge.  Consults: urology  Significant Diagnostic Studies:   Signs of epididymo-orchitis with reactive hydrocele and mild scrotal thickening, hypoechoic area in the epididymis is favored to represent sequela of severe inflammation perhaps with small abscess. Follow-up is suggested to ensure resolution.   Query mild improvement with respect to the hypervascularity noted in the RIGHT testis but with persistent hypervascularity on the RIGHT as compared to the LEFT, comparison imaging (image 137 of  142)   Given severity of above findings urologic consultation may be helpful to guide further management.     Electronically Signed   By: Zetta Bills M.D.   On: 07/11/2021 14:07  Treatments: rocephin>zosyn +doxycycline  Discharge Exam: Blood pressure 121/75, pulse 67, temperature 97.6 F (36.4 C), resp. rate 16, height 5\' 11"  (1.803 m), weight 120.2 kg, SpO2 99 %. General appearance: alert and cooperative Resp: clear to auscultation bilaterally Cardio: regular rate and rhythm, S1, S2 normal, no murmur, click, rub or gallop GI: soft, non-tender; bowel sounds normal; no masses,  no organomegaly Extremities: extremities normal, atraumatic, no cyanosis or edema Right testicle swelling is better.  Disposition: Discharge disposition: 01-Home or Self Care       Discharge Instructions     Diet - low sodium heart healthy   Complete by: As directed    Increase activity slowly   Complete by: As directed       Allergies as of 07/12/2021       Reactions   Codeine Other (See Comments), Rash, Nausea And Vomiting        Medication List     STOP taking these medications    ciprofloxacin 500 MG tablet Commonly known as: CIPRO   ibuprofen 200 MG tablet Commonly known as: ADVIL   multivitamin capsule       TAKE these medications    allopurinol 300 MG tablet Commonly known as: ZYLOPRIM Take 300 mg by mouth daily.   amoxicillin-clavulanate 875-125 MG tablet Commonly known as: Augmentin Take 1 tablet by mouth 2 (two) times daily for 8 days.   aspirin 81 MG EC tablet Take 1 tablet by mouth daily.   cyanocobalamin 1000 MCG tablet Take 1 tablet by mouth daily.  doxycycline 100 MG tablet Commonly known as: VIBRA-TABS Take 1 tablet (100 mg total) by mouth every 12 (twelve) hours for 7 days.   fluticasone 50 MCG/ACT nasal spray Commonly known as: FLONASE Place 1 spray into both nostrils daily.   Omega-3 1000 MG Caps Take 2 capsules by mouth daily.    oxyCODONE-acetaminophen 5-325 MG tablet Commonly known as: PERCOCET/ROXICET Take 1 tablet by mouth every 4 (four) hours as needed for moderate pain.   polyethylene glycol 17 g packet Commonly known as: MIRALAX / GLYCOLAX Take 17 g by mouth daily.   tetrahydrozoline 0.05 % ophthalmic solution Place 1 drop into both eyes daily as needed.        Follow-up Information     Maryland Pink, MD Follow up in 1 week(s).   Specialty: Family Medicine Contact information: [address] [place] [address] [phone]         Billey Co, MD Follow up in 1 week(s).   Specialty: Urology Contact information: [place] [address] [phone]                 Signed: Sharen Hones 07/12/2021, 12:08 PM

## 2021-07-14 LAB — CULTURE, BLOOD (SINGLE): Culture: NO GROWTH

## 2021-08-11 ENCOUNTER — Ambulatory Visit: Payer: 59 | Admitting: Urology

## 2023-04-24 IMAGING — US US SCROTUM
1 series · 13 of 25 positions shown · non-contrast
Comparison: Comparison made with the study July 09, 2021.

CLINICAL DATA: A 55-year-old male with epididymo-orchitis noted on
previous sonogram.

EXAM:
ULTRASOUND OF SCROTUM
TECHNIQUE: Complete ultrasound examination of the testicles, epididymis, and
other scrotal structures was performed.

[Series 1: us scrotum · 144 acquisitions, 13 frames shown]
[im 1/144]
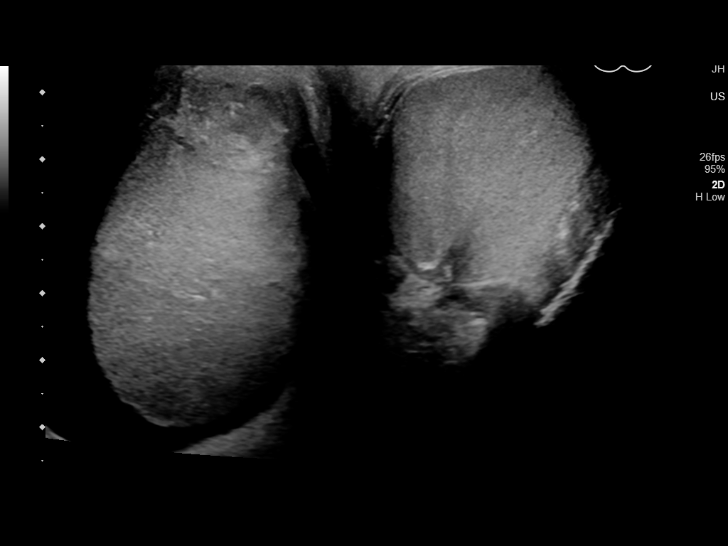
[im 12/144]
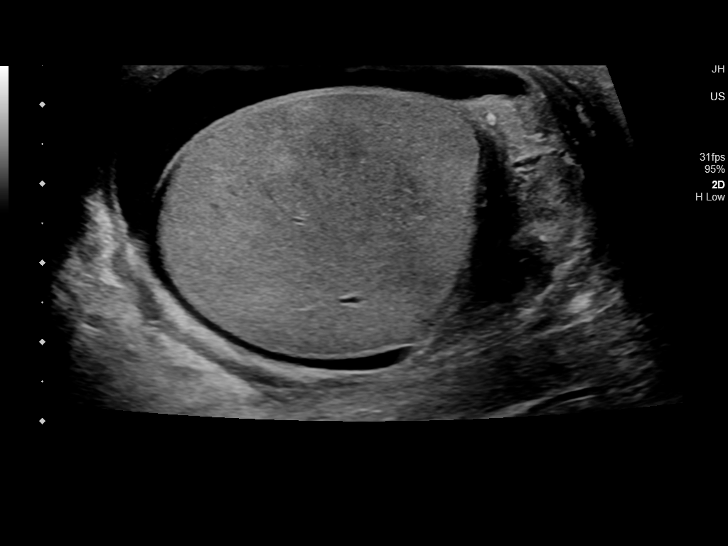
[im 24/144]
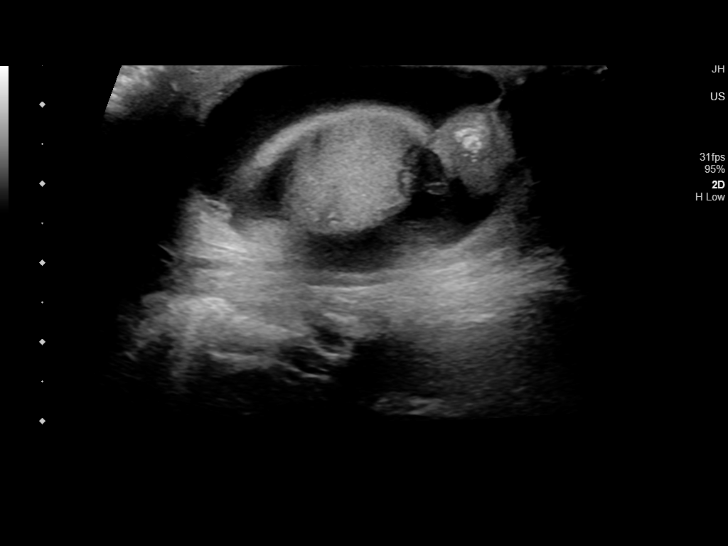
[im 36/144]
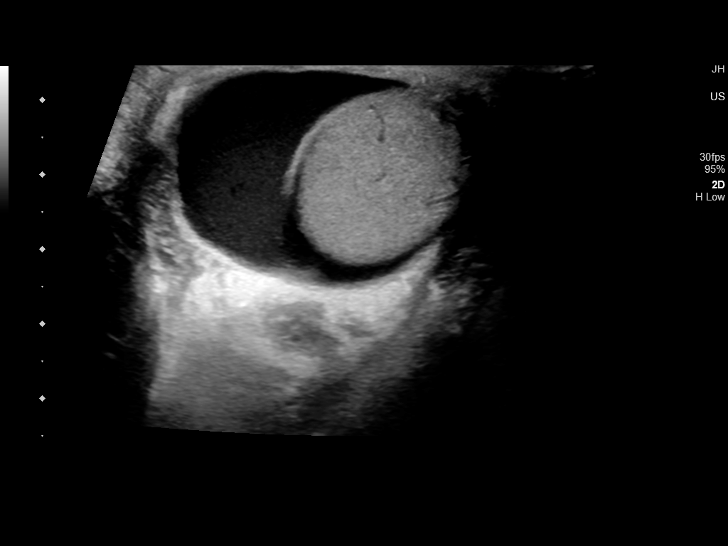
[im 48/144]
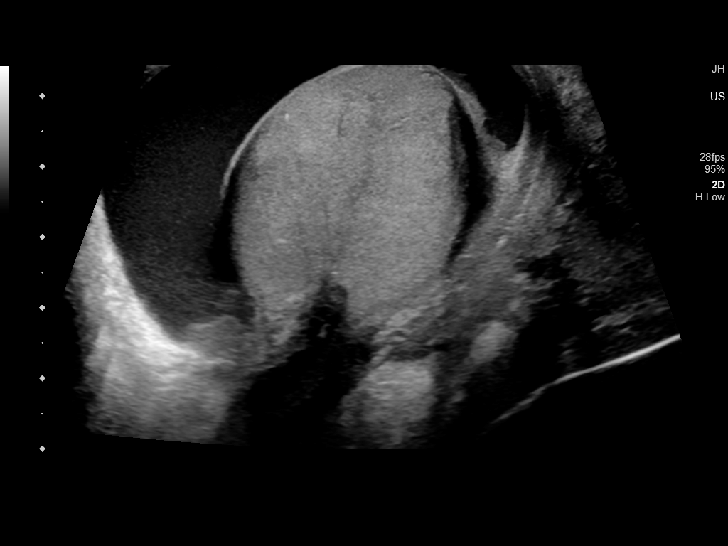
[im 60/144]
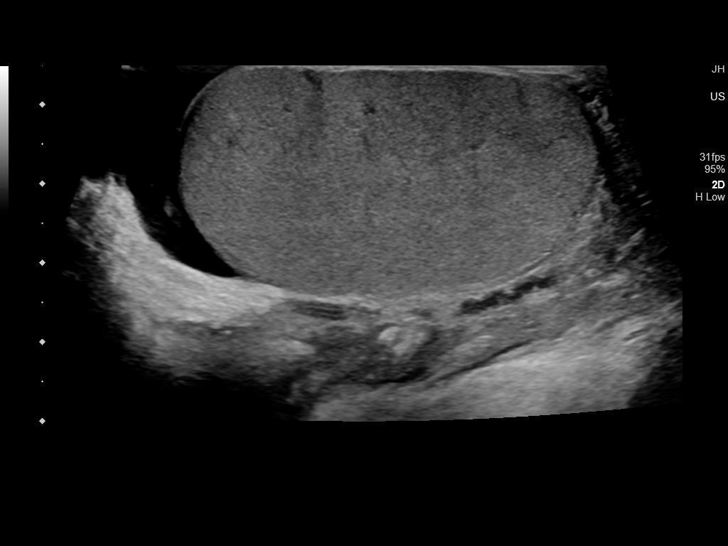
[im 72/144]
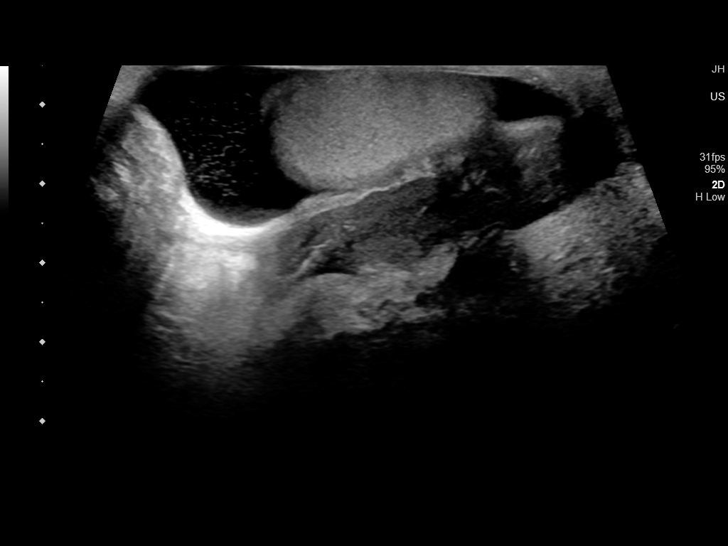
[im 84/144]
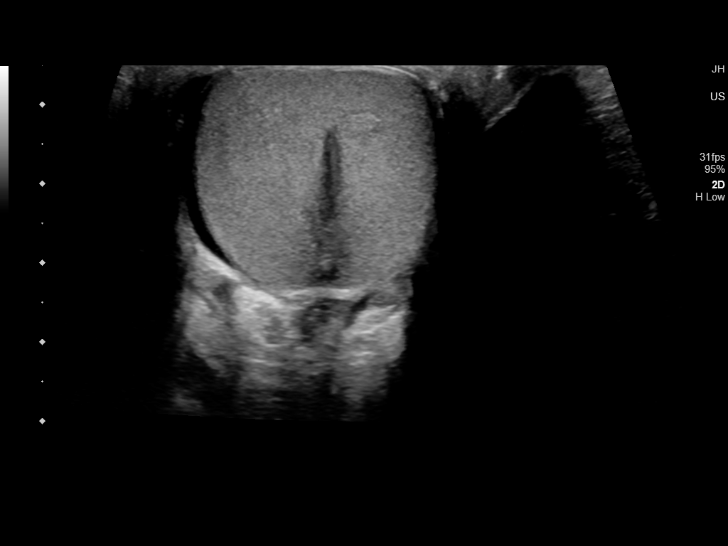
[im 96/144]
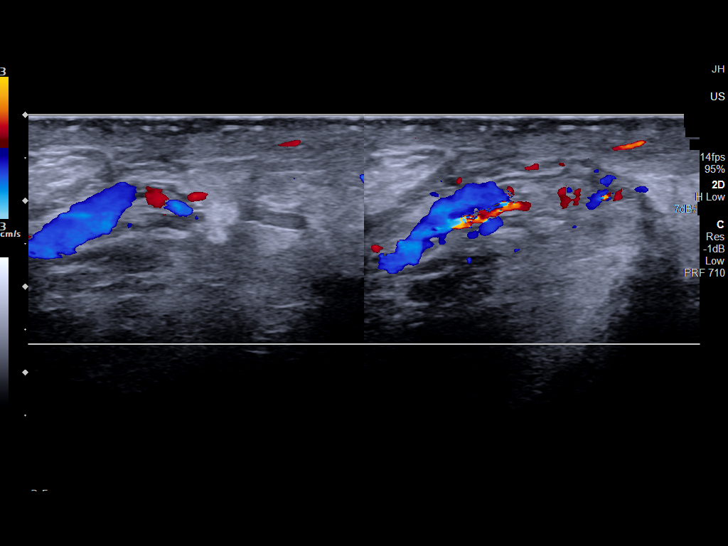
[im 108/144]
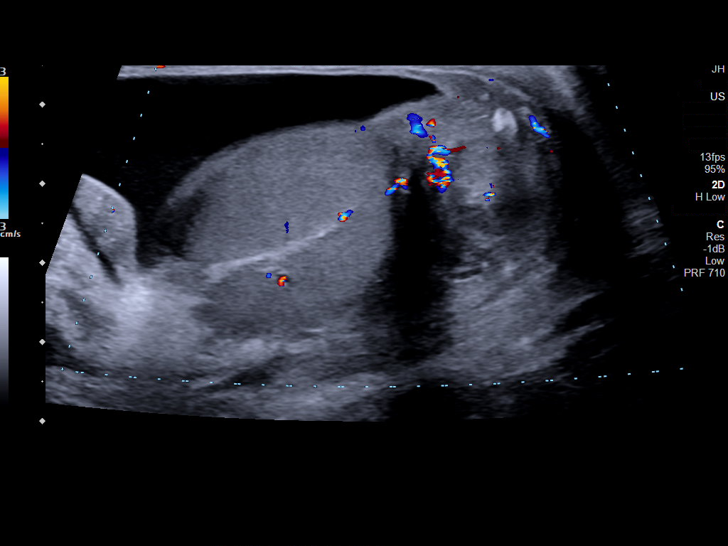
[im 120/144]
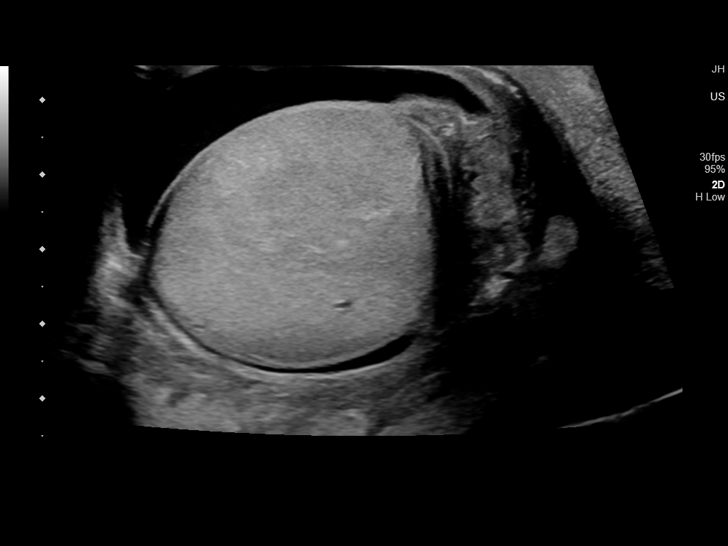
[im 132/144]
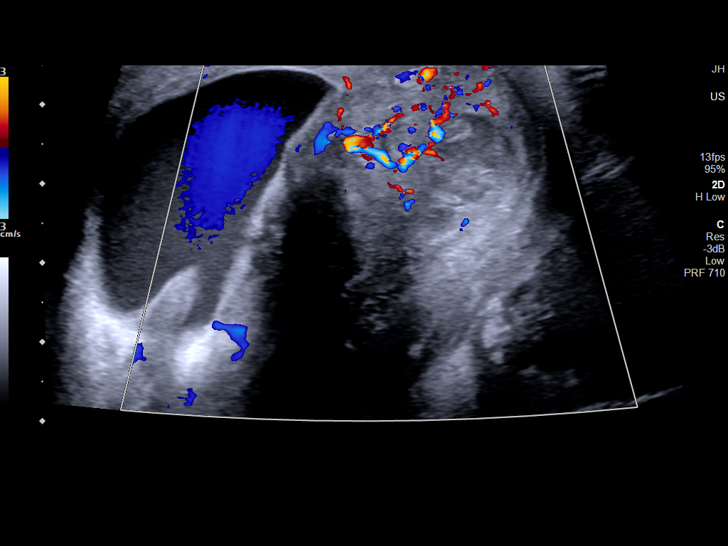
[im 144/144]
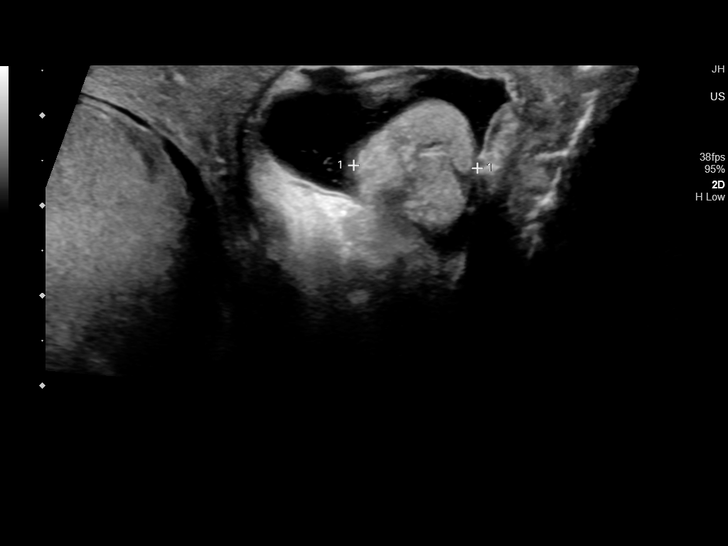

[13 of 25 positions shown; findings below may reference images not displayed]

FINDINGS: Right testicle

Measurements: 4.4 x 3.6 x 4.4 cm. No mass or microlithiasis
visualized.

Left testicle

Measurements: 5.4 x 3 x 3.2 cm. No mass or microlithiasis
visualized.

Right epididymis: Heterogeneous appearance of the RIGHT epididymis
with expanded area in the tail. Markedly hyperemic. Subjective
decrease in the amount of hyperemia associated with the RIGHT testis
as compared to previous imaging. Hypoechoic area more focally in the
epididymal tail does not show color Doppler flow to a substantial
degree, nearly anechoic area seen on image 105 within the epididymal
tail is contiguous with hypoechoic areas, this measures 7 mm. LEFT.
These areas are surrounded by hyperemia.

Left epididymis: Cyst in the LEFT epididymis measuring 1 x 0.5 x
cm epididymis otherwise unremarkable.

Hydrocele:  Present on the RIGHT moderate size.

Varicocele:  None visualized.

Scrotal thickening.
IMPRESSION: Signs of epididymo-orchitis with reactive hydrocele and mild scrotal
thickening, hypoechoic area in the epididymis is favored to
represent sequela of severe inflammation perhaps with small abscess.
Follow-up is suggested to ensure resolution.

Query mild improvement with respect to the hypervascularity noted in
the RIGHT testis but with persistent hypervascularity on the RIGHT
as compared to the LEFT, comparison imaging (image 137 of 142)

Given severity of above findings urologic consultation may be
helpful to guide further management.

## 2024-06-12 ENCOUNTER — Other Ambulatory Visit: Payer: Self-pay | Admitting: Sports Medicine

## 2024-06-12 DIAGNOSIS — M25461 Effusion, right knee: Secondary | ICD-10-CM

## 2024-06-12 DIAGNOSIS — G8929 Other chronic pain: Secondary | ICD-10-CM

## 2024-06-12 DIAGNOSIS — M1711 Unilateral primary osteoarthritis, right knee: Secondary | ICD-10-CM

## 2024-06-13 ENCOUNTER — Emergency Department
Admission: EM | Admit: 2024-06-13 | Discharge: 2024-06-13 | Disposition: A | Discharge status: Home / Self Care | Attending: Emergency Medicine | Admitting: Emergency Medicine

## 2024-06-13 ENCOUNTER — Encounter: Payer: Self-pay | Admitting: *Deleted

## 2024-06-13 ENCOUNTER — Other Ambulatory Visit: Payer: Self-pay

## 2024-06-13 DIAGNOSIS — S81811A Laceration without foreign body, right lower leg, initial encounter: Secondary | ICD-10-CM | POA: Diagnosis present

## 2024-06-13 DIAGNOSIS — Z23 Encounter for immunization: Secondary | ICD-10-CM | POA: Diagnosis not present

## 2024-06-13 DIAGNOSIS — W268XXA Contact with other sharp object(s), not elsewhere classified, initial encounter: Secondary | ICD-10-CM | POA: Diagnosis not present

## 2024-06-13 MED ORDER — CEPHALEXIN 500 MG PO CAPS
500.0000 mg | ORAL_CAPSULE | Freq: Two times a day (BID) | ORAL | 0 refills | Status: AC
Start: 2024-06-13 — End: 2024-06-18

## 2024-06-13 MED ORDER — TETANUS-DIPHTH-ACELL PERTUSSIS 5-2.5-18.5 LF-MCG/0.5 IM SUSY
0.5000 mL | PREFILLED_SYRINGE | Freq: Once | INTRAMUSCULAR | Status: AC
Start: 2024-06-13 — End: 2024-06-13
  Administered 2024-06-13: 0.5 mL via INTRAMUSCULAR
  Filled 2024-06-13: qty 0.5

## 2024-06-13 MED ORDER — LIDOCAINE-EPINEPHRINE (PF) 2 %-1:200000 IJ SOLN
10.0000 mL | Freq: Once | INTRAMUSCULAR | Status: AC
  Administered 2024-06-13: 10 mL
  Filled 2024-06-13: qty 20

## 2024-06-13 NOTE — ED Triage Notes (Addendum)
 Pt has laceration to right lower leg.  Pt sates sheet metal cut his leg.  Bleeding controlled.  Pt alert  speech clear.    Pt to room 47 from triage

## 2024-06-13 NOTE — ED Provider Notes (Signed)
 [facility] Emergency Department Provider Note     Event Date/Time   First MD Initiated Contact with Patient 06/13/24 1619     (approximate)   History   Laceration   HPI  Francisco Roman is a 58 y.o. male with a past medical history of diverticulitis, GERD and gout presents to the ED for evaluation of a laceration to his right lower extremity.  Patient reports he was moving a sheet metal and it slipped and sliced his leg.  Sensation and motor function remain intact.  Bleeding is controlled.  Unknown tetanus status.     Physical Exam   Triage Vital Signs: ED Triage Vitals  Encounter Vitals Group     BP 06/13/24 1616 (!) 146/97     Girls Systolic BP Percentile --      Girls Diastolic BP Percentile --      Boys Systolic BP Percentile --      Boys Diastolic BP Percentile --      Pulse Rate 06/13/24 1616 77     Resp 06/13/24 1616 18     Temp 06/13/24 1616 (!) 97.1 F (36.2 C)     Temp Source 06/13/24 1616 Oral     SpO2 06/13/24 1616 96 %     Weight 06/13/24 1614 285 lb (129.3 kg)     Height 06/13/24 1614 5' 11 (1.803 m)     Head Circumference --      Peak Flow --      Pain Score 06/13/24 1614 2     Pain Loc --      Pain Education --      Exclude from Growth Chart --     Most recent vital signs: Vitals:   06/13/24 1616  BP: (!) 146/97  Pulse: 77  Resp: 18  Temp: (!) 97.1 F (36.2 C)  SpO2: 96%    General Awake, no distress.  HEENT NCAT.  CV:  Good peripheral perfusion.  RESP:  Normal effort.  ABD:  No distention.  Other:  Approximately 4 cm laceration to the lower anterior medial aspect of the left leg.  Pedal pulses palpated 2+ bilaterally.  Neurovascular status intact all throughout.  Good capillary refills.   ED Results / Procedures / Treatments   Labs (all labs ordered are listed, but only abnormal results are displayed) Labs Reviewed - No data to display  No results found.  PROCEDURES:  Critical Care performed:  No  .Laceration Repair  Date/Time: 06/13/2024 7:11 PM  Performed by: Margrette Monte A, PA-C Authorized by: Margrette Monte A, PA-C   Consent:    Consent obtained:  Verbal   Consent given by:  Patient   Risks discussed:  Infection, pain, vascular damage, poor wound healing, poor cosmetic result, need for additional repair and nerve damage   Alternatives discussed:  No treatment Universal protocol:    Patient identity confirmed:  Verbally with patient Anesthesia:    Anesthesia method:  Local infiltration   Local anesthetic:  Lidocaine  2% WITH epi Laceration details:    Location:  Leg   Leg location:  R lower leg   Length (cm):  4   Depth (mm):  1 Pre-procedure details:    Preparation:  Patient was prepped and draped in usual sterile fashion Exploration:    Hemostasis achieved with:  Direct pressure Treatment:    Area cleansed with:  Povidone-iodine and saline   Amount of cleaning:  Extensive   Irrigation solution:  Sterile saline  Irrigation method:  Syringe and pressure wash Skin repair:    Repair method:  Sutures   Suture size:  3-0   Suture material:  Nylon   Suture technique:  Running locked   Number of sutures:  1 Approximation:    Approximation:  Close Repair type:    Repair type:  Simple Post-procedure details:    Dressing:  Non-adherent dressing   Procedure completion:  Tolerated    MEDICATIONS ORDERED IN ED: Medications  lidocaine -EPINEPHrine  (XYLOCAINE  W/EPI) 2 %-1:200000 (PF) injection 10 mL (10 mLs Infiltration Given by Other 06/13/24 1634)  Tdap (BOOSTRIX) injection 0.5 mL (0.5 mLs Intramuscular Given 06/13/24 1634)     IMPRESSION / MDM / ASSESSMENT AND PLAN / ED COURSE  I reviewed the triage vital signs and the nursing notes.                               58 y.o. male presents to the emergency department for evaluation and treatment of laceration. See HPI for further details.   Differential diagnosis includes, but is not limited to laceration,  abrasion, tetanus  Patient's presentation is most consistent with acute, uncomplicated illness.  Patient is alert and oriented.  He is hemodynamic stable.  Bleeding controlled during my evaluation.  Physical exam findings are stated above.  Please see procedure note for laceration repair.  Patient tolerated well.  Tetanus updated during this visit.  Advised patient to return in 7 to 10 days for suture removal.  Patient verbalized understanding.  Short course of antibiotics Keflex  sent to pharmacy.  Patient stable condition for discharge home.  ED return precaution discussed.  FINAL CLINICAL IMPRESSION(S) / ED DIAGNOSES   Final diagnoses:  Laceration of right lower extremity, initial encounter   Rx / DC Orders   ED Discharge Orders          Ordered    cephALEXin  (KEFLEX ) 500 MG capsule  2 times daily        06/13/24 1729           Note:  This document was prepared using Dragon voice recognition software and may include unintentional dictation errors.    Margrette, Errin Chewning A, PA-C 06/13/24 LLEWELLYN    Arlander Charleston, MD 06/13/24 [phone]

## 2024-06-13 NOTE — Discharge Instructions (Addendum)
 Go to primary care or return to the ED in 7 to 10 days for suture removal.    Your tetanus was updated during this visit.  Keep sutures dry for first 24 hrs. Keep suture site of clean & dry. Gently use soap & water after first 24 hrs. DO NOT USE alcohol, hydrogen peroxide etc, to clean skin. You may cover the incision with clean gauze & replace it after your daily shower for your comfort.

## 2024-06-25 ENCOUNTER — Inpatient Hospital Stay
Admission: RE | Admit: 2024-06-25 | Discharge: 2024-06-25 | Disposition: A | Source: Ambulatory Visit | Attending: Sports Medicine

## 2024-06-25 DIAGNOSIS — G8929 Other chronic pain: Secondary | ICD-10-CM

## 2024-06-25 DIAGNOSIS — M25461 Effusion, right knee: Secondary | ICD-10-CM

## 2024-06-25 DIAGNOSIS — M1711 Unilateral primary osteoarthritis, right knee: Secondary | ICD-10-CM
